# Patient Record
Sex: Female | Born: 1970 | Race: White | Hispanic: No | Marital: Married | Smoking: Never smoker
Health system: Southern US, Community
[De-identification: ages and names within clinical notes are randomized; demographics above are authoritative.]

## PROBLEM LIST (undated history)

## (undated) DIAGNOSIS — E119 Type 2 diabetes mellitus without complications: Secondary | ICD-10-CM

## (undated) DIAGNOSIS — I10 Essential (primary) hypertension: Secondary | ICD-10-CM

## (undated) HISTORY — PX: ABDOMINAL HYSTERECTOMY: SHX81

## (undated) HISTORY — PX: COLON SURGERY: SHX602

## (undated) HISTORY — PX: TONSILLECTOMY: SUR1361

---

## 1999-03-01 ENCOUNTER — Other Ambulatory Visit: Admission: RE | Admit: 1999-03-01 | Discharge: 1999-03-01 | Payer: Self-pay | Admitting: Otolaryngology

## 2001-02-13 ENCOUNTER — Encounter: Payer: Self-pay | Admitting: Internal Medicine

## 2001-02-13 ENCOUNTER — Ambulatory Visit (HOSPITAL_COMMUNITY): Admission: RE | Admit: 2001-02-13 | Discharge: 2001-02-13 | Payer: Self-pay | Admitting: Internal Medicine

## 2003-06-01 ENCOUNTER — Ambulatory Visit (HOSPITAL_COMMUNITY): Admission: RE | Admit: 2003-06-01 | Discharge: 2003-06-01 | Payer: Self-pay | Admitting: Family Medicine

## 2003-06-01 ENCOUNTER — Encounter: Payer: Self-pay | Admitting: Family Medicine

## 2003-06-09 ENCOUNTER — Encounter: Payer: Self-pay | Admitting: Family Medicine

## 2003-06-09 ENCOUNTER — Ambulatory Visit (HOSPITAL_COMMUNITY): Admission: RE | Admit: 2003-06-09 | Discharge: 2003-06-09 | Payer: Self-pay | Admitting: Family Medicine

## 2003-11-24 ENCOUNTER — Ambulatory Visit (HOSPITAL_COMMUNITY): Admission: RE | Admit: 2003-11-24 | Discharge: 2003-11-24 | Payer: Self-pay | Admitting: Family Medicine

## 2005-03-04 ENCOUNTER — Ambulatory Visit: Payer: Self-pay | Admitting: Family Medicine

## 2005-03-18 ENCOUNTER — Ambulatory Visit: Payer: Self-pay | Admitting: Family Medicine

## 2005-05-16 ENCOUNTER — Ambulatory Visit: Payer: Self-pay | Admitting: Family Medicine

## 2005-05-20 ENCOUNTER — Ambulatory Visit: Payer: Self-pay | Admitting: Internal Medicine

## 2005-05-20 ENCOUNTER — Ambulatory Visit (HOSPITAL_COMMUNITY): Admission: RE | Admit: 2005-05-20 | Discharge: 2005-05-20 | Payer: Self-pay | Admitting: Internal Medicine

## 2005-07-01 ENCOUNTER — Ambulatory Visit: Payer: Self-pay | Admitting: Family Medicine

## 2005-07-03 ENCOUNTER — Emergency Department (HOSPITAL_COMMUNITY): Admission: EM | Admit: 2005-07-03 | Discharge: 2005-07-03 | Payer: Self-pay | Admitting: Emergency Medicine

## 2005-10-02 ENCOUNTER — Ambulatory Visit: Payer: Self-pay | Admitting: Family Medicine

## 2005-10-03 ENCOUNTER — Ambulatory Visit (HOSPITAL_COMMUNITY): Admission: RE | Admit: 2005-10-03 | Discharge: 2005-10-03 | Payer: Self-pay | Admitting: Family Medicine

## 2006-01-28 ENCOUNTER — Ambulatory Visit: Payer: Self-pay | Admitting: Family Medicine

## 2006-11-05 ENCOUNTER — Ambulatory Visit: Payer: Self-pay | Admitting: Family Medicine

## 2006-11-11 ENCOUNTER — Ambulatory Visit: Payer: Self-pay | Admitting: Family Medicine

## 2006-12-12 ENCOUNTER — Ambulatory Visit: Payer: Self-pay | Admitting: Family Medicine

## 2007-09-09 ENCOUNTER — Ambulatory Visit: Payer: Self-pay | Admitting: Internal Medicine

## 2007-09-11 ENCOUNTER — Ambulatory Visit (HOSPITAL_COMMUNITY): Admission: RE | Admit: 2007-09-11 | Discharge: 2007-09-11 | Payer: Self-pay | Admitting: Internal Medicine

## 2007-10-01 ENCOUNTER — Encounter: Payer: Self-pay | Admitting: Family Medicine

## 2007-10-09 ENCOUNTER — Ambulatory Visit: Payer: Self-pay | Admitting: Internal Medicine

## 2007-10-09 ENCOUNTER — Ambulatory Visit: Payer: Self-pay | Admitting: Family Medicine

## 2007-10-10 ENCOUNTER — Emergency Department (HOSPITAL_COMMUNITY): Admission: EM | Admit: 2007-10-10 | Discharge: 2007-10-10 | Payer: Self-pay | Admitting: Emergency Medicine

## 2007-10-15 ENCOUNTER — Ambulatory Visit (HOSPITAL_COMMUNITY): Admission: RE | Admit: 2007-10-15 | Discharge: 2007-10-15 | Payer: Self-pay | Admitting: Family Medicine

## 2007-10-22 ENCOUNTER — Encounter (HOSPITAL_COMMUNITY): Admission: RE | Admit: 2007-10-22 | Discharge: 2007-11-21 | Payer: Self-pay | Admitting: Family Medicine

## 2007-10-26 ENCOUNTER — Ambulatory Visit (HOSPITAL_COMMUNITY): Admission: RE | Admit: 2007-10-26 | Discharge: 2007-10-26 | Payer: Self-pay | Admitting: Family Medicine

## 2008-05-22 ENCOUNTER — Emergency Department (HOSPITAL_COMMUNITY): Admission: EM | Admit: 2008-05-22 | Discharge: 2008-05-23 | Payer: Self-pay | Admitting: Emergency Medicine

## 2008-06-13 ENCOUNTER — Encounter (INDEPENDENT_AMBULATORY_CARE_PROVIDER_SITE_OTHER): Payer: Self-pay | Admitting: *Deleted

## 2008-06-13 DIAGNOSIS — N281 Cyst of kidney, acquired: Secondary | ICD-10-CM | POA: Insufficient documentation

## 2008-06-15 ENCOUNTER — Ambulatory Visit (HOSPITAL_COMMUNITY): Admission: RE | Admit: 2008-06-15 | Discharge: 2008-06-15 | Payer: Self-pay | Admitting: Family Medicine

## 2008-06-15 ENCOUNTER — Encounter: Payer: Self-pay | Admitting: Family Medicine

## 2008-06-16 ENCOUNTER — Telehealth: Payer: Self-pay | Admitting: Family Medicine

## 2008-06-16 ENCOUNTER — Ambulatory Visit (HOSPITAL_COMMUNITY): Admission: RE | Admit: 2008-06-16 | Discharge: 2008-06-16 | Payer: Self-pay | Admitting: Family Medicine

## 2008-09-26 ENCOUNTER — Other Ambulatory Visit: Admission: RE | Admit: 2008-09-26 | Discharge: 2008-09-26 | Payer: Self-pay | Admitting: Obstetrics and Gynecology

## 2008-10-26 ENCOUNTER — Ambulatory Visit (HOSPITAL_COMMUNITY): Admission: RE | Admit: 2008-10-26 | Discharge: 2008-10-27 | Payer: Self-pay | Admitting: Obstetrics & Gynecology

## 2008-10-26 ENCOUNTER — Encounter: Payer: Self-pay | Admitting: Obstetrics & Gynecology

## 2008-12-02 ENCOUNTER — Ambulatory Visit (HOSPITAL_COMMUNITY): Admission: RE | Admit: 2008-12-02 | Discharge: 2008-12-02 | Payer: Self-pay | Admitting: Urology

## 2008-12-25 ENCOUNTER — Telehealth (INDEPENDENT_AMBULATORY_CARE_PROVIDER_SITE_OTHER): Payer: Self-pay | Admitting: Family Medicine

## 2009-02-23 ENCOUNTER — Ambulatory Visit: Payer: Self-pay | Admitting: Family Medicine

## 2009-02-23 DIAGNOSIS — E669 Obesity, unspecified: Secondary | ICD-10-CM | POA: Insufficient documentation

## 2009-02-24 LAB — CONVERTED CEMR LAB
BUN: 8 mg/dL (ref 6–23)
Basophils Absolute: 0 10*3/uL (ref 0.0–0.1)
Basophils Relative: 1 % (ref 0–1)
Calcium: 9.2 mg/dL (ref 8.4–10.5)
Cholesterol: 238 mg/dL — ABNORMAL HIGH (ref 0–200)
Creatinine, Ser: 0.63 mg/dL (ref 0.40–1.20)
Glucose, Bld: 101 mg/dL — ABNORMAL HIGH (ref 70–99)
Hemoglobin: 13.3 g/dL (ref 12.0–15.0)
MCHC: 31 g/dL (ref 30.0–36.0)
Monocytes Absolute: 0.4 10*3/uL (ref 0.1–1.0)
Neutro Abs: 4 10*3/uL (ref 1.7–7.7)
RDW: 14.7 % (ref 11.5–15.5)
Sodium: 139 meq/L (ref 135–145)
Total CHOL/HDL Ratio: 4.9

## 2009-04-13 ENCOUNTER — Telehealth: Payer: Self-pay | Admitting: Family Medicine

## 2009-04-24 ENCOUNTER — Ambulatory Visit: Payer: Self-pay | Admitting: Family Medicine

## 2009-04-24 DIAGNOSIS — I10 Essential (primary) hypertension: Secondary | ICD-10-CM | POA: Insufficient documentation

## 2009-04-24 DIAGNOSIS — E785 Hyperlipidemia, unspecified: Secondary | ICD-10-CM | POA: Insufficient documentation

## 2009-04-24 DIAGNOSIS — E049 Nontoxic goiter, unspecified: Secondary | ICD-10-CM | POA: Insufficient documentation

## 2009-04-28 ENCOUNTER — Ambulatory Visit (HOSPITAL_COMMUNITY): Admission: RE | Admit: 2009-04-28 | Discharge: 2009-04-28 | Payer: Self-pay | Admitting: Family Medicine

## 2009-05-29 ENCOUNTER — Telehealth: Payer: Self-pay | Admitting: Family Medicine

## 2009-08-08 ENCOUNTER — Encounter (INDEPENDENT_AMBULATORY_CARE_PROVIDER_SITE_OTHER): Payer: Self-pay

## 2009-08-08 ENCOUNTER — Ambulatory Visit: Payer: Self-pay | Admitting: Family Medicine

## 2009-08-08 DIAGNOSIS — F3289 Other specified depressive episodes: Secondary | ICD-10-CM | POA: Insufficient documentation

## 2009-08-08 DIAGNOSIS — F329 Major depressive disorder, single episode, unspecified: Secondary | ICD-10-CM

## 2009-08-09 ENCOUNTER — Ambulatory Visit (HOSPITAL_COMMUNITY): Payer: Self-pay | Admitting: Psychiatry

## 2009-08-10 DIAGNOSIS — J309 Allergic rhinitis, unspecified: Secondary | ICD-10-CM | POA: Insufficient documentation

## 2009-08-18 ENCOUNTER — Telehealth: Payer: Self-pay | Admitting: Family Medicine

## 2009-09-05 ENCOUNTER — Telehealth: Payer: Self-pay | Admitting: Family Medicine

## 2009-09-18 ENCOUNTER — Emergency Department (HOSPITAL_COMMUNITY): Admission: EM | Admit: 2009-09-18 | Discharge: 2009-09-18 | Payer: Self-pay | Admitting: Emergency Medicine

## 2009-09-18 ENCOUNTER — Telehealth: Payer: Self-pay | Admitting: Family Medicine

## 2009-09-18 ENCOUNTER — Encounter: Payer: Self-pay | Admitting: Family Medicine

## 2009-10-05 ENCOUNTER — Encounter (INDEPENDENT_AMBULATORY_CARE_PROVIDER_SITE_OTHER): Payer: Self-pay | Admitting: *Deleted

## 2010-01-23 IMAGING — US US RENAL
1 series · 13 of 23 positions shown · non-contrast
Comparison: none

Addendum BeginsOriginal report by Dr. Ahern.  Following addendum by Dr. Sa Eed?Andriusha on 11/15/2006:
 This addendum is given for the purpose of comparing CT scan performed at [facility] [[facility]OS[facility]] dated 09/02/2007 with ultrasound performed at [HOSPITAL] 10/26/2007.  Note is made that on the patient?s CT scan, an exophytic lesion which was indeterminate was seen off the interpolar region of the left kidney.  The lesion does not have the typical appearance of cyst by CT scan or ultrasound.  Therefore, MRI of the abdomen is recommended to exclude neoplasm.   

 Addendum Ends
HISTORY: Abnormal CT, renal lesion

[Series 1: unknown · 0.35mm/px · 13 of 23 slices shown]
[im 1/23]
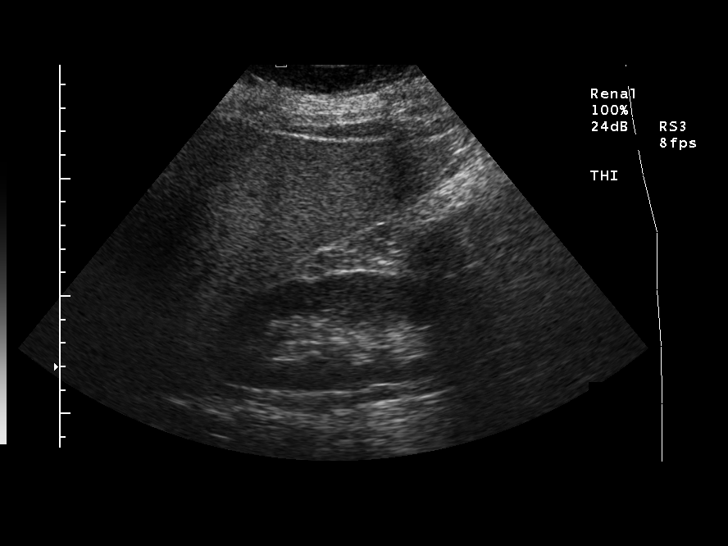
[im 3/23]
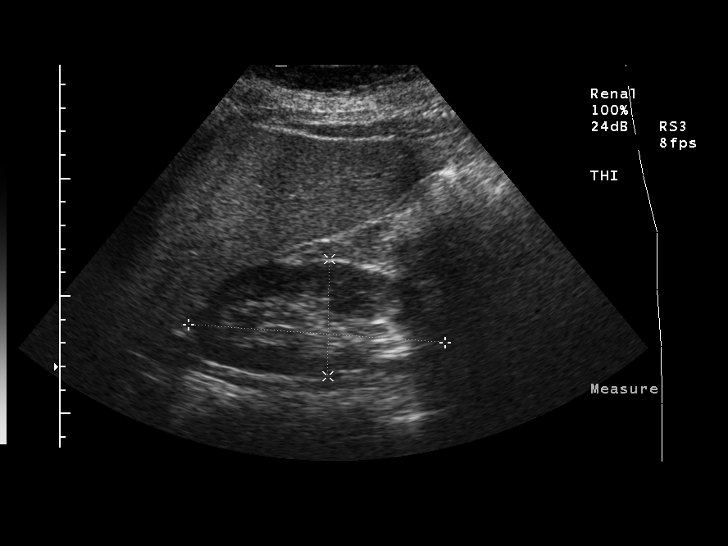
[im 5/23]
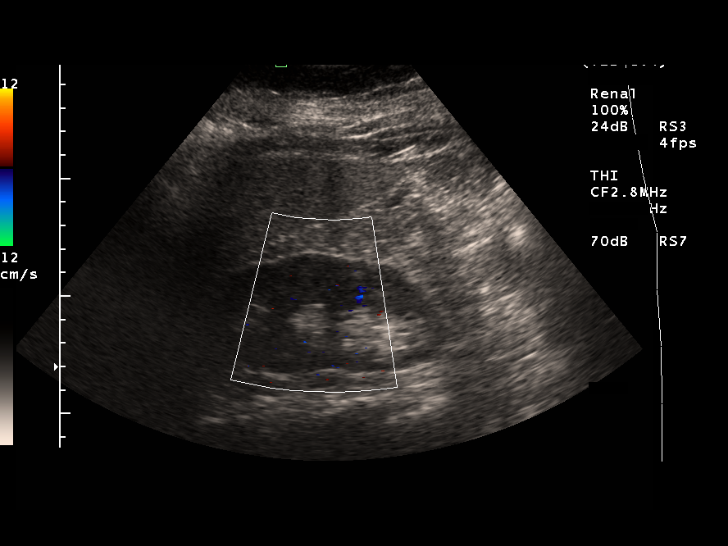
[im 7/23]
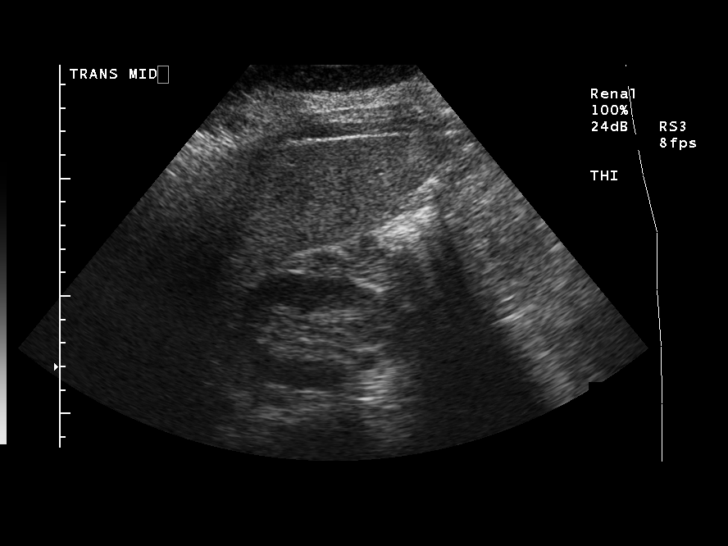
[im 8/23]
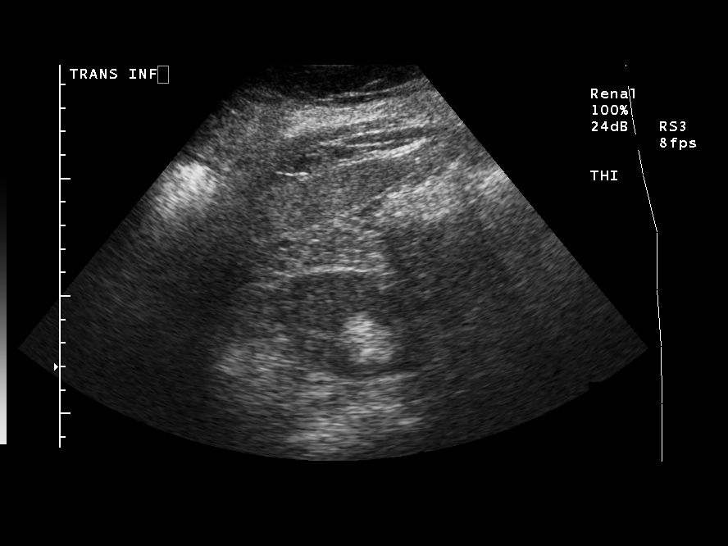
[im 10/23]
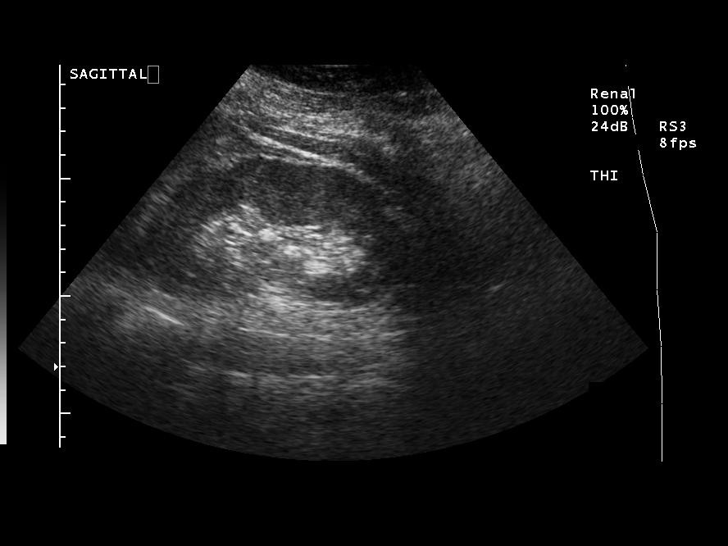
[im 12/23]
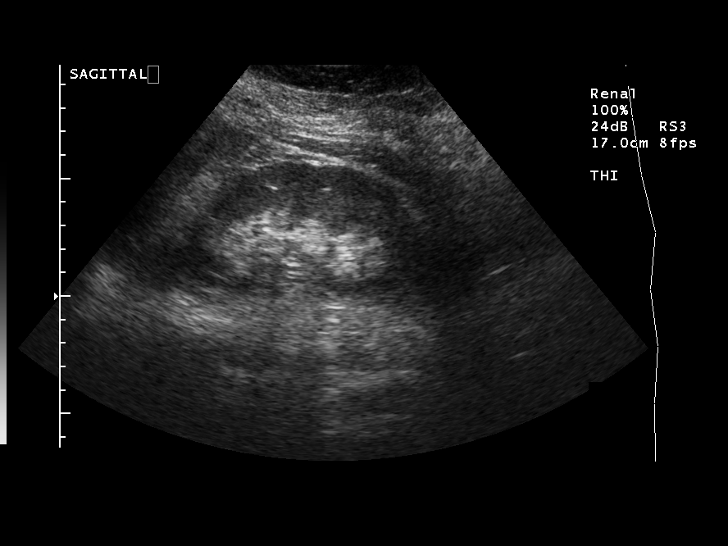
[im 14/23]
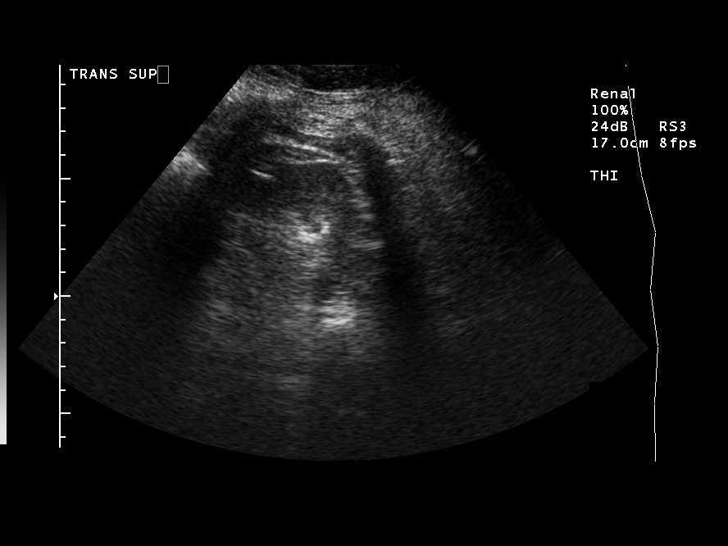
[im 16/23]
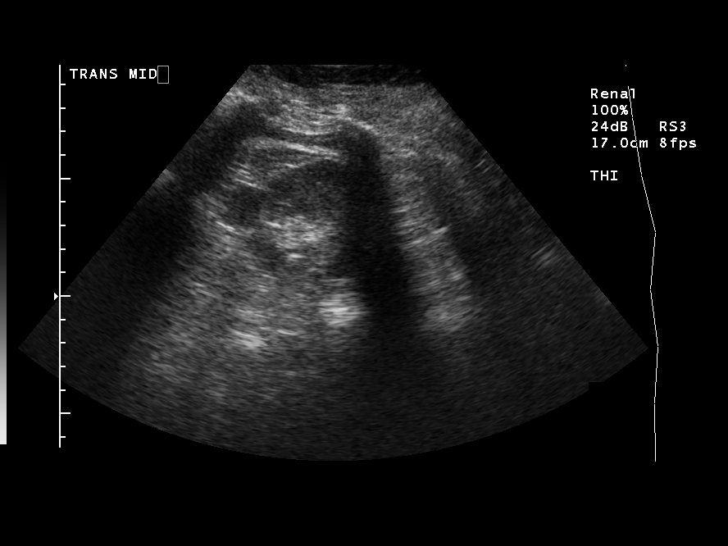
[im 17/23]
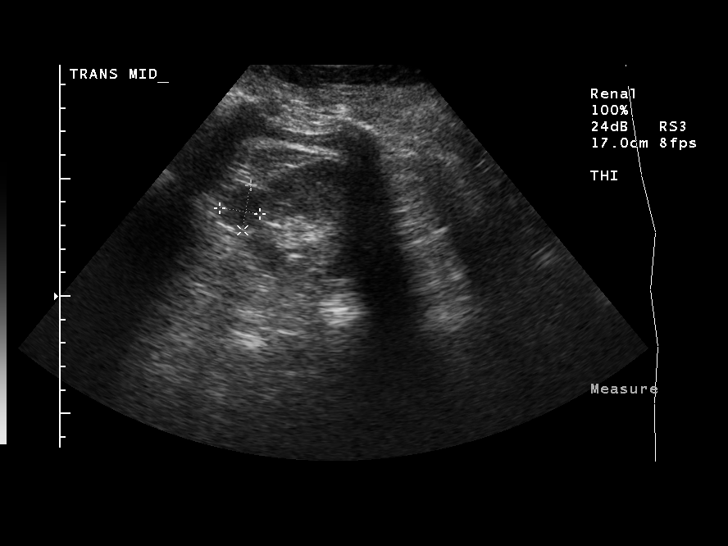
[im 19/23]
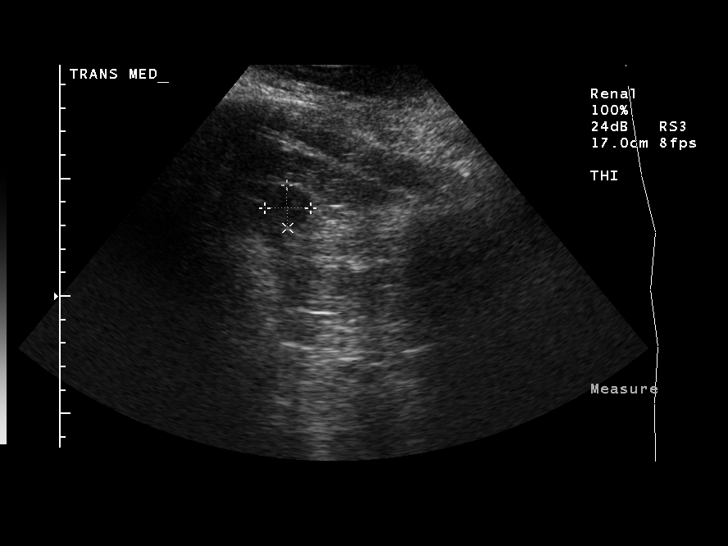
[im 21/23]
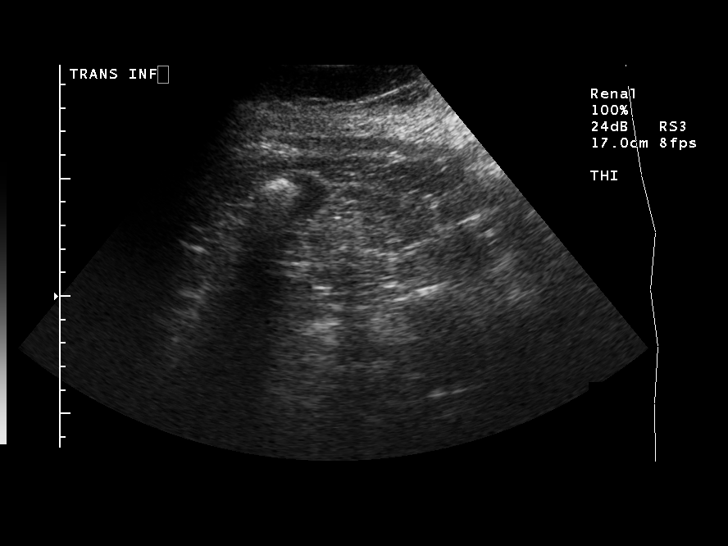
[im 23/23]
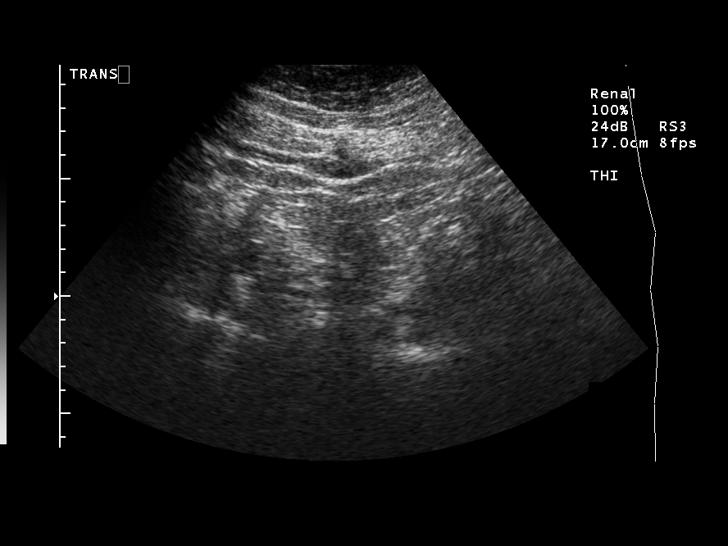

[13 of 23 positions shown; findings below may reference images not displayed]

RENAL ULTRASOUND:

Sonography of kidneys and urinary bladder performed.
No preceding CT is available at this institution for comparison.

Kidneys measure 11.0 cm length right and 11.6 cm length left.
Normal renal cortical thickness and echogenicity bilaterally.
No shadowing calcification or hydronephrosis.
Tiny hypoechoic nodule seen in inferior pole left kidney medially, 2.0 x 1.7 x
1.8 cm.
This contains enhanced through-transmission on several images but scattered low
level internal echoes suggesting mildly complicated cyst.
No other renal mass identified.
Bladder is decompressed and inadequately evaluated.
Incidentally noted increased hepatic echogenicity, question fatty infiltration.
IMPRESSION: Question fatty liver.
Probable complicated cyst inferior pole left kidney 2.0 cm greatest size, though
no preceding CT is available for comparison.
Recommend any prior outside CT examination be provided for direct comparison.
Unless the CT can be obtained, recommend followup ultrasound in 4 to 6 months to
confirm stability.

## 2010-04-06 ENCOUNTER — Encounter: Payer: Self-pay | Admitting: Family Medicine

## 2010-07-09 ENCOUNTER — Ambulatory Visit (HOSPITAL_COMMUNITY): Admission: RE | Admit: 2010-07-09 | Discharge: 2010-07-09 | Payer: Self-pay | Admitting: Family Medicine

## 2010-07-10 ENCOUNTER — Ambulatory Visit (HOSPITAL_COMMUNITY): Admission: RE | Admit: 2010-07-10 | Discharge: 2010-07-10 | Payer: Self-pay | Admitting: Internal Medicine

## 2010-10-05 ENCOUNTER — Emergency Department (HOSPITAL_COMMUNITY)
Admission: EM | Admit: 2010-10-05 | Discharge: 2010-10-05 | Payer: Self-pay | Source: Home / Self Care | Admitting: Emergency Medicine

## 2010-10-06 ENCOUNTER — Emergency Department (HOSPITAL_COMMUNITY)
Admission: EM | Admit: 2010-10-06 | Discharge: 2010-10-06 | Payer: Self-pay | Source: Home / Self Care | Admitting: Emergency Medicine

## 2010-10-15 LAB — COMPREHENSIVE METABOLIC PANEL
ALT: 35 U/L (ref 0–35)
AST: 25 U/L (ref 0–37)
Albumin: 3.8 g/dL (ref 3.5–5.2)
Alkaline Phosphatase: 97 U/L (ref 39–117)
BUN: 19 mg/dL (ref 6–23)
CO2: 25 mEq/L (ref 19–32)
Calcium: 8.9 mg/dL (ref 8.4–10.5)
Chloride: 97 mEq/L (ref 96–112)
Creatinine, Ser: 0.73 mg/dL (ref 0.4–1.2)
GFR calc Af Amer: >60 mL/min (ref >60)
GFR calc non Af Amer: >60 mL/min (ref >60)
Glucose, Bld: 113 mg/dL — ABNORMAL HIGH (ref 70–99)
Potassium: 3.5 mEq/L (ref 3.5–5.1)
Sodium: 132 mEq/L — ABNORMAL LOW (ref 135–145)
Total Bilirubin: 0.3 mg/dL (ref 0.3–1.2)
Total Protein: 7.2 g/dL (ref 6.0–8.3)

## 2010-10-15 LAB — URINALYSIS, ROUTINE W REFLEX MICROSCOPIC
Bilirubin Urine: NEGATIVE
Bilirubin Urine: NEGATIVE
Hgb urine dipstick: NEGATIVE
Hgb urine dipstick: NEGATIVE
Ketones, ur: NEGATIVE mg/dL
Nitrite: NEGATIVE
Nitrite: NEGATIVE
Protein, ur: NEGATIVE mg/dL
Protein, ur: NEGATIVE mg/dL
Specific Gravity, Urine: 1.025 (ref 1.005–1.030)
Specific Gravity, Urine: 1.025 (ref 1.005–1.030)
Urine Glucose, Fasting: NEGATIVE mg/dL
Urine Glucose, Fasting: NEGATIVE mg/dL
Urobilinogen, UA: 0.2 mg/dL (ref 0.0–1.0)
Urobilinogen, UA: 0.2 mg/dL (ref 0.0–1.0)
pH: 6 (ref 5.0–8.0)
pH: 6 (ref 5.0–8.0)

## 2010-10-15 LAB — CBC
HCT: 38.8 % (ref 36.0–46.0)
Hemoglobin: 13.3 g/dL (ref 12.0–15.0)
MCH: 28.4 pg (ref 26.0–34.0)
MCHC: 34.3 g/dL (ref 30.0–36.0)
MCV: 82.9 fL (ref 78.0–100.0)
Platelets: 381 10*3/uL (ref 150–400)
RBC: 4.68 MIL/uL (ref 3.87–5.11)
RDW: 13.2 % (ref 11.5–15.5)
WBC: 7 10*3/uL (ref 4.0–10.5)

## 2010-10-15 LAB — DIFFERENTIAL
Basophils Absolute: 0.1 10*3/uL (ref 0.0–0.1)
Basophils Relative: 1 % (ref 0–1)
Eosinophils Absolute: 0.2 10*3/uL (ref 0.0–0.7)
Eosinophils Relative: 3 % (ref 0–5)
Lymphocytes Relative: 43 % (ref 12–46)
Lymphs Abs: 3 10*3/uL (ref 0.7–4.0)
Monocytes Absolute: 0.5 10*3/uL (ref 0.1–1.0)
Monocytes Relative: 7 % (ref 3–12)
Neutro Abs: 3.3 10*3/uL (ref 1.7–7.7)
Neutrophils Relative %: 47 % (ref 43–77)

## 2010-10-15 LAB — LIPASE, BLOOD: Lipase: 20 U/L (ref 11–59)

## 2010-10-15 LAB — POCT PREGNANCY, URINE: Preg Test, Ur: NEGATIVE

## 2010-10-20 ENCOUNTER — Encounter: Payer: Self-pay | Admitting: Obstetrics and Gynecology

## 2010-10-21 ENCOUNTER — Encounter: Payer: Self-pay | Admitting: Family Medicine

## 2010-10-30 NOTE — Letter (Signed)
Summary: medical release  medical release   Imported By: Lind Guest 04/06/2010 14:47:39  _____________________________________________________________________  External Attachment:    Type:   Image     Comment:   External Document

## 2010-10-30 NOTE — Letter (Signed)
Summary: phone notes  phone notes   Imported By: Lind Guest 03/30/2010 15:00:12  _____________________________________________________________________  External Attachment:    Type:   Image     Comment:   External Document

## 2010-10-30 NOTE — Letter (Signed)
Summary: demo  demo   Imported By: Lind Guest 03/30/2010 14:56:45  _____________________________________________________________________  External Attachment:    Type:   Image     Comment:   External Document

## 2010-10-30 NOTE — Letter (Signed)
Summary: labs  labs   Imported By: Lind Guest 03/30/2010 14:57:39  _____________________________________________________________________  External Attachment:    Type:   Image     Comment:   External Document

## 2010-10-30 NOTE — Letter (Signed)
Summary: history and physical  history and physical   Imported By: Lind Guest 03/30/2010 14:57:14  _____________________________________________________________________  External Attachment:    Type:   Image     Comment:   External Document

## 2010-10-30 NOTE — Letter (Signed)
Summary: 1st Missed Appt.  [facility]  [address]   [place], [place] [address]   Phone: [phone]  Fax: [phone]    October 05, 2009  MRN: [id number]  [facility] [address] [place], [place]  [address]  Dear Ms. Ucci,  At [facility], we make every attempt to fit patients into our schedule by reserving several appointment slots for same-day appointments.  However, we cannot always make appointments for patients the same day they are calling.  At the end of the day, we look back at our schedule and find that because of last-minute cancellations and patients not showing up for their scheduled appointments, we have several appointment slots that are left open and could have been used by another person who really needed it.  In the past, you may have been one of the patients who could not get in when you needed to.  But recently, you were one of the patients with an appointment that you didn't show up for or canceled too late for [place] to fill it.  We choose not to charge no-show or last minute cancellation fees to our patients, like many other offices do.  We do not wish to institute that policy and hope we never have to.  However, we kindly request that you assist [place] by providing at least 24 hours' notice if you can't make your appointment.  If no-shows or late cancellations become habitual (i.e. Three or more in a one-year period), we may terminate the physician-patient relationship.    Thank you for your consideration and cooperation.   Altamease Oiler

## 2010-10-30 NOTE — Letter (Signed)
Summary: x rays  x rays   Imported By: Lind Guest 03/30/2010 15:00:37  _____________________________________________________________________  External Attachment:    Type:   Image     Comment:   External Document

## 2010-10-30 NOTE — Assessment & Plan Note (Signed)
Summary: OV   Vital Signs:  Patient profile:   40 year old female Menstrual status:  hysterectomy Height:      63.5 inches Weight:      201 pounds BMI:     35.17 O2 Sat:      98 % Pulse rate:   86 / minute Pulse rhythm:   regular Resp:     16 per minute BP sitting:   139 / 97  (left arm) Cuff size:   large  Vitals Entered By: Everitt Amber (August 08, 2009 2:19 PM)  Nutrition Counseling: Patient's BMI is greater than 25 and therefore counseled on weight management options. CC: yellow tinged with blood sinus drainage and pressure, headaches and diarrhea for the past 4 days, Thinks the diarrhea may be due to her nerves, states she has been really stressed out lately Is Patient Diabetic? No   CC:  yellow tinged with blood sinus drainage and pressure, headaches and diarrhea for the past 4 days, Thinks the diarrhea may be due to her nerves, and states she has been really stressed out lately.  History of Present Illness: Pt in today stating that she has been under increased stress for some time but mainly in the last one week. Her son has moved out to live with his father who she states is often absent and does not care. She has been tearful, unable to focus and concentrate, and actually thought of hurting herself with a razor in her bathroom 2 nights ago. She called her sister, adnvows that she will not act on any suicidal ideation. She ahs also had gI upset during this time which she attributes to her "nerves" She has been having inc allergy symptoms with esxcessive post nasal drainage but denies any fever or chills.  Preventive Screening-Counseling & Management  Alcohol-Tobacco     Smoking Status: never  Current Medications (verified): 1)  Maxzide 75-50 Mg Tabs (Triamterene-Hctz) .... Take 1 Tablet By Mouth Once A Day  Allergies (verified): No Known Drug Allergies  Review of Systems      See HPI General:  Complains of fatigue. Eyes:  Denies blurring. ENT:  Complains of  earache, nasal congestion, sinus pressure, and sore throat; 4 day h/o upper resp symptoms. CV:  Denies chest pain or discomfort, palpitations, and swelling of feet. Resp:  Denies cough and sputum productive. GI:  Complains of abdominal pain, diarrhea, nausea, and vomiting; denies bloody stools, constipation, and dark tarry stools; 1 week h/o symptoms  since her 99 y/o son left home. GU:  Denies dysuria and urinary frequency; pt is followed by urology for renal cyst which she reports has decreased in size. MS:  Denies joint pain and stiffness. Derm:  Denies itching, lesion(s), and rash. Neuro:  Complains of headaches; denies seizures and sensation of room spinning. Psych:  Complains of anxiety, depression, easily tearful, irritability, mental problems, and suicidal thoughts/plans; pt thought of hurting herself with a razor 2 days ago. Endo:  Denies cold intolerance, excessive hunger, excessive thirst, excessive urination, heat intolerance, polyuria, and weight change. Heme:  Denies abnormal bruising and bleeding. Allergy:  Complains of seasonal allergies; denies hives or rash, itching eyes, and sneezing.  Physical Exam  General:  Well-developed,obese,in no acute distress; alert,appropriate and cooperative throughout examination HEENT: No facial asymmetry,  EOMI, No sinus tenderness, TM's Clear, oropharynx  pink and moist. Erythema and edema of nasal mucosa  Chest: Clear to auscultation bilaterally.  CVS: S1, S2, No murmurs, No S3.   Abd:  Soft, Nontender.  MS: Adequate ROM spine, hips, shoulders and knees.  Ext: No edema.   CNS: CN 2-12 intact, power tone and sensation normal throughout.   Skin: Intact, no visible lesions or rashes.  Psych: Good eye contact, normal affect.  Memory intact, tearful, anxious and depressed appearing.  Head:  Normocephalic and atraumatic without obvious abnormalities. No apparent alopecia or balding.   Impression & Recommendations:  Problem # 1:  DEPRESSION  (ICD-311) Assessment Deteriorated  Her updated medication list for this problem includes:    Zoloft 25 Mg Tabs (Sertraline hcl) .Marland Kitchen... Take 1 tablet by mouth once a day for  4 weeks, start 08/08/2009    Zoloft 50 Mg Tabs (Sertraline hcl) .Marland Kitchen... Take 1 tablet by mouth once a day, start this dose in december on completion of the first 4 weeks of 25mg  one daily  Orders: Psychology Referral (Psychology)  Problem # 2:  HYPERTENSION (ICD-401.9) Assessment: Deteriorated  Her updated medication list for this problem includes:    Maxzide 75-50 Mg Tabs (Triamterene-hctz) .Marland Kitchen... Take 1 tablet by mouth once a day  BP today: 139/97 Prior BP: 140/86 (04/24/2009)  Labs Reviewed: K+: 4.3 (02/23/2009) Creat: : 0.63 (02/23/2009)   Chol: 238 (02/23/2009)   HDL: 49 (02/23/2009)   LDL: 121 (02/23/2009)   TG: 338 (02/23/2009)  Problem # 3:  HYPERLIPIDEMIA (ICD-272.4) Assessment: Comment Only  Orders: T-Lipid Profile ([address])    HDL:49 (02/23/2009)  LDL:121 (02/23/2009)  Chol:238 (02/23/2009)  Trig:338 (02/23/2009), low fat diet discussed and encouraged as well as regular exercise  Problem # 4:  OBESITY (ICD-278.00) Assessment: Improved  Ht: 63.5 (08/08/2009)   Wt: 201 (08/08/2009)   BMI: 35.17 (08/08/2009)  Problem # 5:  RHINOSINUSITIS, ALLERGIC, 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 Take 1 tablet by mouth once a day  Complete Medication List: 1)  Maxzide 75-50 Mg Tabs (Triamterene-hctz) .... Take 1 tablet by mouth once a day 2)  Fexofenadine Hcl 180 Mg Tabs (Fexofenadine hcl) .... Take 1 tablet by mouth once a day 3)  Zoloft 25 Mg Tabs (Sertraline hcl) .... Take 1 tablet by mouth once a day for  4 weeks, start 08/08/2009 4)  Zoloft 50 Mg Tabs (Sertraline hcl) .... Take 1 tablet by mouth once a day, start this dose in december on completion of the first 4 weeks of 25mg  one  daily  Patient Instructions: 1)  f/u in 8 weeks. 2)  you will be referred to therapy and psychiatry, and will start med for depression. 3)  Pls call for help if youir symptoms worsen. 4)  It is important that you exercise regularly at least 20 minutes 5 times a week. If you develop chest pain, have severe difficulty breathing, or feel very tired , stop exercising immediately and seek medical attention. 5)  You need to lose weight. Consider a lower calorie diet and regular exercise.  6)  Lipid Panel prior to visit, ICD-9:  in 2 months Prescriptions: ZOLOFT 50 MG TABS (SERTRALINE HCL) Take 1 tablet by mouth once a day, start this dose in December on completion of the first 4 weeks of 25mg  one daily  #30 x 3   Entered and Authorized by:   Syliva Overman MD   Signed by:   Syliva Overman MD on 08/08/2009   Method used:   Printed then faxed to ...       [facility] (retail)       [address]       [place], [place]  [address]       Ph: [phone] or [phone]       Fax: [phone]   RxID:   [id number] ZOLOFT 25 MG TABS (SERTRALINE HCL) Take 1 tablet by mouth once a day for  4 weeks, start 08/08/2009  #30 x 0   Entered and Authorized by:   Syliva Overman MD   Signed by:   Syliva Overman MD on 08/08/2009   Method used:   Printed then faxed to ...       [facility] (retail)       [address]       [place], [place]  [address]       Ph: [phone] or [phone]       Fax: [phone]   RxID:   [id number] ZOLOFT 25 MG TABS (SERTRALINE HCL) Take 1 tablet by mouth once a day  #30 x 3   Entered and Authorized by:   Syliva Overman MD   Signed by:   Syliva Overman MD on 08/08/2009   Method used:   Printed then faxed to ...       [facility] (retail)       [address]       [place], [place]  [address]       Ph: [phone] or [phone]       Fax:  [phone]   RxID:   [id number] FEXOFENADINE HCL 180 MG TABS (FEXOFENADINE HCL) Take 1 tablet by mouth once a day  #30 x 3   Entered and Authorized by:   Syliva Overman MD   Signed by:   Syliva Overman MD on 08/08/2009   Method used:   Printed then faxed to ...       [facility] (retail)       [address]       [place], [place]  [address]       Ph: [phone] or [phone]       Fax: [phone]   RxID:   [id number]

## 2010-10-30 NOTE — Letter (Signed)
Summary: consults  consults   Imported By: Lind Guest 03/30/2010 14:56:08  _____________________________________________________________________  External Attachment:    Type:   Image     Comment:   External Document

## 2010-10-30 NOTE — Letter (Signed)
Summary: misc  misc   Imported By: Lind Guest 03/30/2010 14:59:36  _____________________________________________________________________  External Attachment:    Type:   Image     Comment:   External Document

## 2010-10-30 NOTE — Letter (Signed)
Summary: office notes  office notes   Imported By: Lind Guest 03/30/2010 14:58:23  _____________________________________________________________________  External Attachment:    Type:   Image     Comment:   External Document

## 2010-12-14 ENCOUNTER — Encounter: Payer: Self-pay | Admitting: Family Medicine

## 2010-12-18 ENCOUNTER — Ambulatory Visit: Payer: Self-pay | Admitting: Gastroenterology

## 2010-12-18 ENCOUNTER — Encounter (INDEPENDENT_AMBULATORY_CARE_PROVIDER_SITE_OTHER): Payer: Self-pay | Admitting: *Deleted

## 2010-12-21 ENCOUNTER — Other Ambulatory Visit (HOSPITAL_COMMUNITY): Payer: Self-pay | Admitting: Family Medicine

## 2010-12-21 ENCOUNTER — Ambulatory Visit (HOSPITAL_COMMUNITY)
Admission: RE | Admit: 2010-12-21 | Discharge: 2010-12-21 | Disposition: A | Discharge status: Home / Self Care | Payer: BC Managed Care – PPO | Source: Ambulatory Visit | Attending: Family Medicine | Admitting: Family Medicine

## 2010-12-21 DIAGNOSIS — R059 Cough, unspecified: Secondary | ICD-10-CM

## 2010-12-21 DIAGNOSIS — R0602 Shortness of breath: Secondary | ICD-10-CM | POA: Insufficient documentation

## 2010-12-21 DIAGNOSIS — R05 Cough: Secondary | ICD-10-CM

## 2010-12-21 DIAGNOSIS — I10 Essential (primary) hypertension: Secondary | ICD-10-CM | POA: Insufficient documentation

## 2010-12-21 DIAGNOSIS — R042 Hemoptysis: Secondary | ICD-10-CM | POA: Insufficient documentation

## 2010-12-27 NOTE — Letter (Signed)
Summary: Generic Letter, Intro to Referring  [facility]  [address]   [place], [place] [address]   Phone: [phone]  Fax: [phone]      December 18, 2010             RE: [name]   [birth year]                 [address]                 [place], [place]  [address]  Dear [name],  Patient was a no show for her appointment today.            Sincerely,    [name]  [facility] Ph: [phone]   Fax: [phone]

## 2011-01-14 LAB — CBC
HCT: 34.9 % — ABNORMAL LOW (ref 36.0–46.0)
HCT: 40.2 % (ref 36.0–46.0)
Hemoglobin: 13.6 g/dL (ref 12.0–15.0)
MCHC: 33.3 g/dL (ref 30.0–36.0)
MCV: 81.5 fL (ref 78.0–100.0)
MCV: 82.9 fL (ref 78.0–100.0)
Platelets: 337 10*3/uL (ref 150–400)
RBC: 4.21 MIL/uL (ref 3.87–5.11)
RBC: 4.93 MIL/uL (ref 3.87–5.11)
WBC: 6.7 10*3/uL (ref 4.0–10.5)

## 2011-01-14 LAB — TYPE AND SCREEN
ABO/RH(D): A POS
Antibody Screen: NEGATIVE

## 2011-01-14 LAB — DIFFERENTIAL
Basophils Relative: 1 % (ref 0–1)
Eosinophils Absolute: 0.2 10*3/uL (ref 0.0–0.7)
Eosinophils Relative: 2 % (ref 0–5)
Lymphs Abs: 2.4 10*3/uL (ref 0.7–4.0)
Monocytes Relative: 6 % (ref 3–12)
Neutrophils Relative %: 66 % (ref 43–77)

## 2011-01-14 LAB — URINALYSIS, ROUTINE W REFLEX MICROSCOPIC
Bilirubin Urine: NEGATIVE
Glucose, UA: NEGATIVE mg/dL
Hgb urine dipstick: NEGATIVE
Protein, ur: NEGATIVE mg/dL
Urobilinogen, UA: 0.2 mg/dL (ref 0.0–1.0)

## 2011-01-14 LAB — COMPREHENSIVE METABOLIC PANEL
Alkaline Phosphatase: 119 U/L — ABNORMAL HIGH (ref 39–117)
BUN: 9 mg/dL (ref 6–23)
CO2: 26 mEq/L (ref 19–32)
Chloride: 103 mEq/L (ref 96–112)
Creatinine, Ser: 0.62 mg/dL (ref 0.4–1.2)
GFR calc non Af Amer: >60 mL/min (ref >60)
Glucose, Bld: 111 mg/dL — ABNORMAL HIGH (ref 70–99)
Total Bilirubin: 0.5 mg/dL (ref 0.3–1.2)

## 2011-02-12 NOTE — Op Note (Signed)
[name], [name]                 ACCOUNT NO.:  [id number]   MEDICAL RECORD NO.:  [id number]          PATIENT TYPE:  OIB   LOCATION:  A307                          FACILITY:  [facility]   PHYSICIAN:  Lazaro Arms, M.D.   DATE OF BIRTH:  [birth year]   DATE OF PROCEDURE:  10/26/2008  DATE OF DISCHARGE:                               OPERATIVE REPORT   PREOPERATIVE DIAGNOSES:  1. Dysmenorrhea.  2. Dyspareunia.  3. Lateral pain.   POSTOPERATIVE DIAGNOSES:  1. Dysmenorrhea.  2. Dyspareunia.  3. Lateral pain.  4. Perimenopausal-appearing ovaries and high, well over the pelvis.   PROCEDURE:  Total vaginal hysterectomy.   SURGEON:  Lazaro Arms, MD   ANESTHESIA:  General endotracheal.   FINDINGS:  The patient had a globular uterus, great support, probably  some small fibroids up in the fundus.  Her ovaries were surprisingly  atrophic in appearance.  They were probably about a cm and half in  greatest dimension.  They appeared to be peri or slightly  postmenopausal.  There was no cyst.  No endometriosis.  No adhesions.  Really, could not get to them safely.  I debated whether or not to do a  laparoscopic removal, but I really felt like they are not causing her  any discomfort after having been in there and seeing.  As a result, I  did a vaginal hysterectomy and left the ovaries.   DESCRIPTION OF OPERATION:  The patient was taken to the operating room,  placed in supine position, where she underwent general endotracheal  anesthesia.  She was placed in dorsal lithotomy position in candy cane  stirrups.  She was prepped and draped in usual sterile fashion.  A Foley  catheter was placed.  Weighted speculum was placed. A 0.5% Marcaine with  1:200,000 epinephrine was injected circumferentially about the cervix.  The electrocautery unit was then used, circumferential incision made.  The posterior cul-de-sac was entered without difficulty.  Uterosacral  ligaments were clamped, cut, and  suture ligated.  Cardinal ligaments  were clamped, cut, and suture ligated. Serial pedicles of the cardinal  ligaments were taken.  The anterior peritoneum was then entered without  difficulty.  The anterior and posterior leaves of broad ligament were  plicated.  Uterine vessels were clamped, cut, and suture ligated.  Serial pedicles were taken of the fundus.  Each pedicle was being  clamped, cut, and suture ligated.  The cervix was removed and uterus was  morcellated.  Utero-ovarian ligaments were crossed clamped bilaterally  and suture ligated with good hemostasis.  I spent quite a bit of time  evaluating whether or not I could get the ovaries, they were quite  small, as stated above, and quite high and I made the decision that  could not have gotten them out vaginally.  I saw no pathology, no  endometriosis, no adhesions, no cysts, and as a result, I decided that  in real time in the OR, should not do a laparoscopic removal, which  would have been my only option.  An 80% of her pain is middle and  20% of  it is lateral.  I am hoping that the fibroids were responsible for that,  but I certainly am aware that there is a possibly that she could have  lateral pain postoperatively, but my best judgment at this time would  lead me to think that her ovaries,  1. Are getting ready to fail.  2. Were not a source of lateral pain or dyspareunia.   I then closed the peritoneum in a purse-string fashion and closed the  vagina anterior to posterior without difficulty.  I irrigated the  vaginal cuff and pelvis vigorously before closure.  All pedicles were  hemostatic.  The patient tolerated the procedure well.  She experienced  150 mL of blood loss and was taken to recovery room in good and stable  condition.  All counts were correct x3.  She received a gram of Ancef  prophylactically.      Lazaro Arms, M.D.  Electronically Signed     LHE/MEDQ  D:  10/26/2008  T:  10/27/2008  Job:   [id number]

## 2011-02-12 NOTE — Assessment & Plan Note (Signed)
[name], [name]                  CHART#:  [id number]   DATE:  09/09/2007                       DOB:  1971-07-16   CHIEF COMPLAINT:  Abdominal pain.   HISTORY OF PRESENT ILLNESS:  [name] is a 40 year old Caucasian female who  presents today for further evaluation of acute onset left-sided  abdominal pain, which began last Wednesday.  She states that her abdomen  began to swell last Wednesday.  She had acute onset left-sided abdominal  pain.  She went to [facility] Emergency [facility]  because of the severity of the pain.  She underwent a CT of the abdomen  and pelvis, which revealed a 2 cm exophytic well-defined, low-density  lesion in the left kidney, felt to possibly be complicated by  proteinaceous debris or hemorrhage.  She also had an 18 mm dominant  follicle in the left ovary 16 mm dominant right ovarian follicle and  mild hydrosalpinx on the left side.  She was told to follow up with GI.  She made the appointment today.  Other labs included a glucose of 107,  BUN 12, creatinine 0.6, total bilirubin 0.3, alkaline phosphatase 96,  AST 20, ALT 24, albumin 3.6, lipase 19, amylase 22, CBC with white count  9900.  Hemoglobin 13.2, platelets 471,000, urine pregnancy test was  negative.  Urinalysis was negative.   She has had some nausea, but no vomiting.  She has had acid  regurgitation.  She takes Aciphex p.r.n.  She states she has gained 9  pounds in the last one week.  She feels puffy all over.  She has chronic  prandial loose stools which she has had as long as she can remember.  At  times she does have some incontinence.  She denies any nocturnal  symptoms, no melena or rectal bleeding.   CURRENT MEDICATIONS:  Aciphex as needed.   ALLERGIES:  No known drug allergies.   PAST MEDICAL HISTORY:  She has sinus problems.  She states that her  menstrual cycles have been regular up until last month.  She was 16 days  late for her menses.  Tubal ligation 15 years  ago.  Tonsillectomy.   FAMILY HISTORY:  Mother had a brain aneurysm.  Father has a history of  diabetes and blood clots.  She states her maternal grandfather had  Crohn's disease and died of colon cancer in his 49s.  Maternal  grandmother with possible Crohn's disease as well.   SOCIAL HISTORY:  She is married.  She has two children.  She is employed  at [employer] on third shift.  She has never been a smoker.  No alcohol use.   REVIEW OF SYSTEMS:  See HPI for GI.  Constitutional:  See HPI.  Cardiopulmonary:  No chest pain, shortness of breath.  Genitourinary:  No urinary hesitancy, dysuria, frequency or hematuria.  See HPI for GYN.   PHYSICAL EXAMINATION:  VITAL SIGNS:  Weight 204, height 5 feet 2 inches,  temperature 97.8, blood pressure 130/92.  Pulse is 88.  GENERAL:  Pleasant, obese, Caucasian female in no acute distress.  Skin  warm and dry no jaundice.  HEENT:  Sclerae nonicteric.  Oropharyngeal mucosa, moist and pink.  No  lesions, erythema, or exudate.  No lymphadenopathy, thyromegaly.  CHEST:  Lungs are clear to auscultation.  CARDIOVASCULAR:  Cardiac exam reveals regular rate and rhythm.  Normal  S1 and S2.  No murmurs, rubs, or gallops.  ABDOMEN:  Positive bowel sounds, obese, but symmetrical.  Soft.  She has  diffuse tenderness throughout lower her a abdomen to deep palpation, but  more intense on left low to left mid abdomen.  She becomes tearful with  palpation in this region.  No rebound or guarding.  No organomegaly or  masses appreciated.  No CVA tenderness.  EXTREMITIES:  No edema.   IMPRESSION:  [name] is a 40 year old lady who presents with acute onset  left mid abdominal pain of one week duration.  I had discussed the case  with Dr. Jena Gauss.  She has two issues on her CT which may or may not be  the cause of her pain, but remains a possibility.  She has a 2 cm  exophytic left renal lesions, which appears complicated and needs  further evaluation.  In addition, she has  an a dominant follicle in the  left ovary measuring 18 mm and possible hydrosalpinx.  She has  associated menstrual irregularity and recent painful menses.  She has  chronic post prandial loose stools possibly due to IBS.  No significant  change in her stools at this time.   PLAN:  1. Evaluation with renal ultrasound and pelvic vaginal ultrasound.  2. Retrieve CD of recent CT films.  3. Vicodin 5/500 mg #20 one p.o. t.i.d. p.r.n. pain, zero refills.  4. Further recommendations to follow.       [name], P.A.  Electronically Signed     R. Roetta Sessions, M.D.  Electronically Signed    LL/MEDQ  D:  09/09/2007  T:  09/10/2007  Job:  [id number]   cc:   Milus Mallick. Lodema Hong, M.D.

## 2011-02-15 NOTE — Op Note (Signed)
[name], [name]                 ACCOUNT NO.:  [id number]   MEDICAL RECORD NO.:  [id number]          PATIENT TYPE:  AMB   LOCATION:  DAY                           FACILITY:  [facility]   PHYSICIAN:  R. Roetta Sessions, M.D. DATE OF BIRTH:  [birth year]   DATE OF PROCEDURE:  05/20/2005  DATE OF DISCHARGE:                                 OPERATIVE REPORT   PROCEDURE:  Diagnostic colonoscopy.   INDICATIONS FOR PROCEDURE:  The patient is a 40 year old lady who  experienced some low volume painless hematochezia last week, approximately  three episodes. She saw Dr. Lodema Hong. Dr. Lodema Hong called me. She has been set  up for colonoscopy. This is in the setting of otherwise normal bowel  movements. She denies constipation or diarrhea. No abdominal pain. No  melena. She says for the past two days she has not seen any blood per  rectum, saw no blood with the prep. There is no family history of IBD or  colorectal neoplasia. Colonoscopy is now being done. This approach has been  discussed with the patient. Potential risks, benefits, and alternatives have  been reviewed and questions answered.  She is agreeable. Please see documentation in the medical record.   PROCEDURE NOTE:  O2 saturation, blood pressure, pulse, and respirations were  monitored throughout the entire procedure. Conscious sedation with Versed 4  mg IV and Demerol 75 mg IV in divided doses.   INSTRUMENT:  Olympus video chip system.   FINDINGS:  Digital rectal exam revealed no abnormalities.   ENDOSCOPIC FINDINGS:  Prep was adequate.   Rectum:  Examination of the rectal mucosa including retroflexed view of the  anal verge revealed single excoriated appearing hemorrhoid. Otherwise,  rectal mucosa appeared normal.   Colon:  Colonic mucosa was surveyed from the rectosigmoid junction through  the left, transverse, and right colon to the area of the appendiceal  orifice, ileocecal valve, and cecum. These structures were well seen and  photographed for the record. From this level, the scope was slowly  withdrawn, and all previously mentioned mucosal surfaces were again seen.  The colonic mucosa was well seen and appeared normal. Technically, this was  a very easy colonoscopy with the cecum being a straight shot. The patient  tolerated the procedure well and was reactive to endoscopy.   IMPRESSION:  1.  Single excoriated anocolonic hemorrhoid. Otherwise normal rectum.  2.  Normal colon.   RECOMMENDATIONS:  1.  Hemorrhoid literature provided to Ms. Christenberry.  2.  Ten-day course of Anusol HC suppository, one per rectum at bedtime.  3.  If bleeding recurs, she is let me know, as further evaluation would be      warranted.      Jonathon Bellows, M.D.  Electronically Signed     RMR/MEDQ  D:  05/20/2005  T:  05/20/2005  Job:  [id number]   cc:   Milus Mallick. Lodema Hong, M.D.  [address]  [place], [place] [address]  Fax: [phone]

## 2012-08-18 ENCOUNTER — Encounter (HOSPITAL_COMMUNITY): Payer: Self-pay | Admitting: *Deleted

## 2012-08-18 ENCOUNTER — Emergency Department (HOSPITAL_COMMUNITY)
Admission: EM | Admit: 2012-08-18 | Discharge: 2012-08-18 | Disposition: A | Discharge status: Home / Self Care | Payer: Self-pay | Attending: Emergency Medicine | Admitting: Emergency Medicine

## 2012-08-18 DIAGNOSIS — J029 Acute pharyngitis, unspecified: Secondary | ICD-10-CM | POA: Insufficient documentation

## 2012-08-18 DIAGNOSIS — IMO0001 Reserved for inherently not codable concepts without codable children: Secondary | ICD-10-CM | POA: Insufficient documentation

## 2012-08-18 DIAGNOSIS — R51 Headache: Secondary | ICD-10-CM | POA: Insufficient documentation

## 2012-08-18 MED ORDER — AMOXICILLIN 500 MG PO CAPS: 500.0000 mg | ORAL_CAPSULE | Freq: Three times a day (TID) | ORAL | Status: DC

## 2012-08-18 MED ORDER — HYDROCODONE-ACETAMINOPHEN 5-325 MG PO TABS: 1.0000 | ORAL_TABLET | ORAL | Status: DC | PRN

## 2012-08-18 MED ORDER — MELOXICAM 7.5 MG PO TABS: ORAL_TABLET | ORAL | Status: DC

## 2012-08-18 NOTE — ED Notes (Signed)
Sore throat and headache since last pm.

## 2012-08-18 NOTE — ED Provider Notes (Signed)
History     CSN: [id number]  Arrival date & time 08/18/12  1300   First MD Initiated Contact with Patient 08/18/12 1438      Chief Complaint  Patient presents with  . Sore Throat    (Consider location/radiation/quality/duration/timing/severity/associated sxs/prior treatment) Patient is a 41 y.o. female presenting with pharyngitis. The history is provided by the patient.  Sore Throat This is a new problem. The current episode started yesterday. The problem occurs constantly. The problem has been gradually worsening. Associated symptoms include headaches, myalgias and a sore throat. Pertinent negatives include no abdominal pain, arthralgias, chest pain, coughing or neck pain. The symptoms are aggravated by swallowing. She has tried acetaminophen and rest for the symptoms. The treatment provided no relief.    History reviewed. No pertinent past medical history.  Past Surgical History  Procedure Date  . Abdominal hysterectomy   . Tonsillectomy     History reviewed. No pertinent family history.  History  Substance Use Topics  . Smoking status: Never Smoker   . Smokeless tobacco: Not on file  . Alcohol Use: No    OB History    Grav Para Term Preterm Abortions TAB SAB Ect Mult Living                  Review of Systems  Constitutional: Negative for activity change.       All ROS Neg except as noted in HPI  HENT: Positive for sore throat and sinus pressure. Negative for nosebleeds and neck pain.   Eyes: Negative for photophobia and discharge.  Respiratory: Negative for cough, shortness of breath and wheezing.   Cardiovascular: Negative for chest pain and palpitations.  Gastrointestinal: Negative for abdominal pain and blood in stool.  Genitourinary: Negative for dysuria, frequency and hematuria.  Musculoskeletal: Positive for myalgias. Negative for back pain and arthralgias.  Skin: Negative.   Neurological: Positive for headaches. Negative for dizziness, seizures and  speech difficulty.  Psychiatric/Behavioral: Negative for hallucinations and confusion.    Allergies  Review of patient's allergies indicates no known allergies.  Home Medications   Current Outpatient Rx  Name  Route  Sig  Dispense  Refill  . ACETAMINOPHEN 500 MG PO TABS   Oral   Take 1,000 mg by mouth daily as needed. For pain         . AMOXICILLIN 500 MG PO CAPS   Oral   Take 1 capsule (500 mg total) by mouth 3 (three) times daily.   21 capsule   0   . MELOXICAM 7.5 MG PO TABS      1 po bid with food   12 tablet   0     BP 151/94  Pulse 73  Temp 98.1 F (36.7 C) (Oral)  Resp 18  Ht 5\' 3"  (1.6 m)  Wt 204 lb (92.534 kg)  BMI 36.14 kg/m2  SpO2 100%  Physical Exam  Nursing note and vitals reviewed. Constitutional: She is oriented to person, place, and time. She appears well-developed and well-nourished.  Non-toxic appearance.  HENT:  Head: Normocephalic.  Right Ear: Tympanic membrane and external ear normal.  Left Ear: Tympanic membrane and external ear normal.       There is increased redness of the posterior pharynx. The uvula is enlarged. The airway is patent and the speech is understandable.  Eyes: EOM and lids are normal. Pupils are equal, round, and reactive to light.  Neck: Normal range of motion. Neck supple. Carotid bruit is not present.  Cardiovascular: Normal rate, regular rhythm, normal heart sounds, intact distal pulses and normal pulses.   Pulmonary/Chest: Breath sounds normal. No respiratory distress.  Abdominal: Soft. Bowel sounds are normal. There is no tenderness. There is no guarding.  Musculoskeletal: Normal range of motion.  Lymphadenopathy:       Head (right side): No submandibular adenopathy present.       Head (left side): No submandibular adenopathy present.    She has no cervical adenopathy.  Neurological: She is alert and oriented to person, place, and time. She has normal strength. No cranial nerve deficit or sensory deficit.  Skin:  Skin is warm and dry.  Psychiatric: She has a normal mood and affect. Her speech is normal.    ED Course  Procedures (including critical care time)   Labs Reviewed  RAPID STREP SCREEN   No results found.   1. Pharyngitis       MDM  I have reviewed nursing notes, vital signs, and all appropriate lab and imaging results for this patient. Patient has a pharyngitis present. Patient advised to use salt water gargles 3-4 times a day. Patient also advised to increase fluids, wash hands frequently, and use Mobic for fever and aching. Prescription for Amoxil 3 times daily also given to the patient.       Kathie Dike, [place] 08/20/12 1332

## 2012-08-22 NOTE — ED Provider Notes (Signed)
Medical screening examination/treatment/procedure(s) were performed by non-physician practitioner and as supervising physician I was immediately available for consultation/collaboration.  Donnetta Hutching, MD 08/22/12 [phone]

## 2014-03-04 ENCOUNTER — Emergency Department (HOSPITAL_COMMUNITY)
Admission: EM | Admit: 2014-03-04 | Discharge: 2014-03-05 | Disposition: A | Discharge status: Home / Self Care | Payer: BC Managed Care – PPO | Attending: Emergency Medicine | Admitting: Emergency Medicine

## 2014-03-04 ENCOUNTER — Encounter (HOSPITAL_COMMUNITY): Payer: Self-pay | Admitting: Emergency Medicine

## 2014-03-04 ENCOUNTER — Emergency Department (HOSPITAL_COMMUNITY): Payer: BC Managed Care – PPO

## 2014-03-04 DIAGNOSIS — Z791 Long term (current) use of non-steroidal anti-inflammatories (NSAID): Secondary | ICD-10-CM | POA: Insufficient documentation

## 2014-03-04 DIAGNOSIS — R110 Nausea: Secondary | ICD-10-CM | POA: Insufficient documentation

## 2014-03-04 DIAGNOSIS — J029 Acute pharyngitis, unspecified: Secondary | ICD-10-CM | POA: Insufficient documentation

## 2014-03-04 DIAGNOSIS — R079 Chest pain, unspecified: Secondary | ICD-10-CM | POA: Insufficient documentation

## 2014-03-04 DIAGNOSIS — Z792 Long term (current) use of antibiotics: Secondary | ICD-10-CM | POA: Insufficient documentation

## 2014-03-04 DIAGNOSIS — Z79899 Other long term (current) drug therapy: Secondary | ICD-10-CM | POA: Insufficient documentation

## 2014-03-04 DIAGNOSIS — R51 Headache: Secondary | ICD-10-CM | POA: Insufficient documentation

## 2014-03-04 DIAGNOSIS — R519 Headache, unspecified: Secondary | ICD-10-CM

## 2014-03-04 DIAGNOSIS — H539 Unspecified visual disturbance: Secondary | ICD-10-CM | POA: Insufficient documentation

## 2014-03-04 HISTORY — DX: Type 2 diabetes mellitus without complications: E11.9

## 2014-03-04 LAB — CBC WITH DIFFERENTIAL/PLATELET
Basophils Absolute: 0.1 10*3/uL (ref 0.0–0.1)
Basophils Relative: 1 % (ref 0–1)
Eosinophils Absolute: 0.2 10*3/uL (ref 0.0–0.7)
Eosinophils Relative: 2 % (ref 0–5)
HCT: 39.9 % (ref 36.0–46.0)
HEMOGLOBIN: 13 g/dL (ref 12.0–15.0)
LYMPHS ABS: 2.9 10*3/uL (ref 0.7–4.0)
Lymphocytes Relative: 34 % (ref 12–46)
MCH: 27.7 pg (ref 26.0–34.0)
MCHC: 32.6 g/dL (ref 30.0–36.0)
MCV: 85.1 fL (ref 78.0–100.0)
MONOS PCT: 6 % (ref 3–12)
Monocytes Absolute: 0.5 10*3/uL (ref 0.1–1.0)
NEUTROS ABS: 5 10*3/uL (ref 1.7–7.7)
NEUTROS PCT: 57 % (ref 43–77)
Platelets: 410 10*3/uL — ABNORMAL HIGH (ref 150–400)
RBC: 4.69 MIL/uL (ref 3.87–5.11)
RDW: 13.6 % (ref 11.5–15.5)
WBC: 8.6 10*3/uL (ref 4.0–10.5)

## 2014-03-04 LAB — BASIC METABOLIC PANEL
BUN: 13 mg/dL (ref 6–23)
CHLORIDE: 99 meq/L (ref 96–112)
CO2: 24 mEq/L (ref 19–32)
Calcium: 9 mg/dL (ref 8.4–10.5)
Creatinine, Ser: 0.67 mg/dL (ref 0.50–1.10)
GFR calc Af Amer: >90 mL/min (ref >90)
GFR calc non Af Amer: >90 mL/min (ref >90)
GLUCOSE: 117 mg/dL — AB (ref 70–99)
POTASSIUM: 3.9 meq/L (ref 3.7–5.3)
Sodium: 138 mEq/L (ref 137–147)

## 2014-03-04 LAB — TROPONIN I: Troponin I: <0.3 ng/mL (ref <0.30)

## 2014-03-04 MED ORDER — DEXAMETHASONE 4 MG PO TABS
12.0000 mg | ORAL_TABLET | Freq: Once | ORAL | Status: AC
  Administered 2014-03-05: 12 mg via ORAL
  Filled 2014-03-04: qty 3

## 2014-03-04 MED ORDER — IBUPROFEN 800 MG PO TABS
800.0000 mg | ORAL_TABLET | Freq: Once | ORAL | Status: AC
  Administered 2014-03-05: 800 mg via ORAL
  Filled 2014-03-04: qty 1

## 2014-03-04 NOTE — ED Notes (Signed)
Sudden onset of sharp upper Left chest pain began today at 1515.  Associated lightheadedness.  R occipital pain radiating to shoulder and between scapulae.

## 2014-03-04 NOTE — ED Provider Notes (Signed)
CSN: [id number]     Arrival date & time 03/04/14  2025 History  This chart was scribed for Dione Booze, MD by Danella Maiers, ED Scribe. This patient was seen in room APA03/[facility]03 and the patient's care was started at 11:26 PM.    Chief Complaint  Patient presents with  . Chest Pain  . Extremity Weakness   The history is provided by the patient. No language interpreter was used.   HPI Comments: [name] is a 43 y.o. female who presents to the Emergency Department complaining of sudden-onset, sharp left upper CP that started today around 3:15pm that has now resolved. She rates the severity of the pain as a 10/10 at its worst. She reports she had associated blurred vision in her left eye. Nothing made the CP better or worse. She states at the same time that her chest started hurting she had pain from her left face down to her left shoulder. The face and CP have now resolved but she is still having a mild sore throat. She reports nausea but no vomiting. She did not take any OTC medications.    Past Medical History  Diagnosis Date  . Diabetes mellitus without complication     boderline   Past Surgical History  Procedure Laterality Date  . Abdominal hysterectomy    . Tonsillectomy    . Colon surgery     History reviewed. No pertinent family history. History  Substance Use Topics  . Smoking status: Never Smoker   . Smokeless tobacco: Not on file  . Alcohol Use: No   OB History   Grav Para Term Preterm Abortions TAB SAB Ect Mult Living                 Review of Systems  HENT: Positive for sore throat.   Eyes: Positive for visual disturbance.  Cardiovascular: Positive for chest pain.  Gastrointestinal: Positive for nausea. Negative for vomiting.  All other systems reviewed and are negative.     Allergies  Review of patient's allergies indicates no known allergies.  Home Medications   Prior to Admission medications   Medication Sig Start Date End Date Taking? Authorizing  Provider  acetaminophen (TYLENOL) 500 MG tablet Take 1,000 mg by mouth daily as needed. For pain    Historical Provider, MD  amoxicillin (AMOXIL) 500 MG capsule Take 1 capsule (500 mg total) by mouth 3 (three) times daily. 08/18/12   Kathie Dike, PA-C  HYDROcodone-acetaminophen (NORCO) 5-325 MG per tablet Take 1 tablet by mouth every 4 (four) hours as needed for pain. 08/18/12   Kathie Dike, PA-C  meloxicam (MOBIC) 7.5 MG tablet 1 po bid with food 08/18/12   Kathie Dike, PA-C   BP 205/99  Pulse 78  Temp(Src) 98 F (36.7 C) (Oral)  Resp 20  Ht 5\' 3"  (1.6 m)  Wt 205 lb (92.987 kg)  BMI 36.32 kg/m2  SpO2 100% Physical Exam  Nursing note and vitals reviewed. Constitutional: She is oriented to person, place, and time. She appears well-developed and well-nourished. No distress.  HENT:  Head: Normocephalic and atraumatic.  Mild edema of the uvula  Eyes: Conjunctivae and EOM are normal. Pupils are equal, round, and reactive to light.  Fundi are normal  Neck: Normal range of motion. No JVD present. No tracheal deviation present.  Cardiovascular: Normal rate and regular rhythm.   No murmur heard. Pulmonary/Chest: Effort normal. No respiratory distress. She exhibits tenderness (Moderate tenderness left parasternal).  Abdominal: Soft.  Bowel sounds are normal. She exhibits no mass. There is no tenderness.  Musculoskeletal: Normal range of motion. She exhibits no edema.  Lymphadenopathy:    She has no cervical adenopathy.  Neurological: She is alert and oriented to person, place, and time. She has normal reflexes. No cranial nerve deficit. She exhibits normal muscle tone. Coordination normal.  Skin: Skin is warm and dry.  Psychiatric: She has a normal mood and affect. Her behavior is normal. Thought content normal.    ED Course  Procedures (including critical care time) Medications - No data to display  DIAGNOSTIC STUDIES: Oxygen Saturation is 100% on RA, normal by my  interpretation.    COORDINATION OF CARE: 11:34 PM- Discussed treatment plan with pt which includes rapid strep, ibuprofen, and decadron. Let her know x-rays labs and EKG were normal. Pt agrees to plan.    Labs Review Results for orders placed during the hospital encounter of 03/04/14  RAPID STREP SCREEN      Result Value Ref Range   Streptococcus, Group A Screen (Direct) NEGATIVE  NEGATIVE  CBC WITH DIFFERENTIAL      Result Value Ref Range   WBC 8.6  4.0 - 10.5 K/uL   RBC 4.69  3.87 - 5.11 MIL/uL   Hemoglobin 13.0  12.0 - 15.0 g/dL   HCT 44.039.9  10.236.0 - 72.546.0 %   MCV 85.1  78.0 - 100.0 fL   MCH 27.7  26.0 - 34.0 pg   MCHC 32.6  30.0 - 36.0 g/dL   RDW 36.613.6  44.011.5 - 34.715.5 %   Platelets 410 (*) 150 - 400 K/uL   Neutrophils Relative % 57  43 - 77 %   Neutro Abs 5.0  1.7 - 7.7 K/uL   Lymphocytes Relative 34  12 - 46 %   Lymphs Abs 2.9  0.7 - 4.0 K/uL   Monocytes Relative 6  3 - 12 %   Monocytes Absolute 0.5  0.1 - 1.0 K/uL   Eosinophils Relative 2  0 - 5 %   Eosinophils Absolute 0.2  0.0 - 0.7 K/uL   Basophils Relative 1  0 - 1 %   Basophils Absolute 0.1  0.0 - 0.1 K/uL  BASIC METABOLIC PANEL      Result Value Ref Range   Sodium 138  137 - 147 mEq/L   Potassium 3.9  3.7 - 5.3 mEq/L   Chloride 99  96 - 112 mEq/L   CO2 24  19 - 32 mEq/L   Glucose, Bld 117 (*) 70 - 99 mg/dL   BUN 13  6 - 23 mg/dL   Creatinine, Ser 4.250.67  0.50 - 1.10 mg/dL   Calcium 9.0  8.4 - 95.610.5 mg/dL   GFR calc non Af Amer >90  >90 mL/min   GFR calc Af Amer >90  >90 mL/min  TROPONIN I      Result Value Ref Range   Troponin I <0.30  <0.30 ng/mL   Imaging Review Dg Chest 2 View  03/04/2014   CLINICAL DATA:  6 hr of sharp left chest pain with extremity weakness  EXAM: CHEST  2 VIEW  COMPARISON:  PA and lateral chest x-ray of November 22, 2010  FINDINGS: The lungs are adequately inflated and clear. The heart and mediastinal structures are normal. There is no pleural effusion or pneumothorax. The bony thorax is  unremarkable.  IMPRESSION: There is no active cardiopulmonary disease.   Electronically Signed   By: Shaconda Hajduk  [place]   On:  03/04/2014 20:55     EKG Interpretation   Date/Time:  Friday March 04 2014 20:32:35 EDT Ventricular Rate:  77 PR Interval:  150 QRS Duration: 94 QT Interval:  364 QTC Calculation: 411 R Axis:   58 Text Interpretation:  Normal sinus rhythm Normal ECG No old tracing to  compare Confirmed by [facility]  MD, Drucella Karbowski ([id number]) on 03/04/2014 11:24:53 PM      MDM   Final diagnoses:  Chest pain  Headache  Sore throat    Chest pain and headache which have resolved. Sore throat which could conceivably be a streptococcal. Strep screen will be obtained. She is given a dose of dexamethasone empirically. There is some chest wall tenderness and this medicine would be appropriate for her costochondritis as well.  ECG and chest x-ray unremarkable and laboratory workup is normal. Strep screen has come back negative. She is discharged with instructions to use over-the-counter analgesics as needed and return should symptoms worsen.  I personally performed the services described in this documentation, which was scribed in my presence. The recorded information has been reviewed and is accurate.     Dione Booze, MD 03/05/14 [phone]

## 2014-03-05 LAB — RAPID STREP SCREEN (MED CTR MEBANE ONLY): STREPTOCOCCUS, GROUP A SCREEN (DIRECT): NEGATIVE

## 2014-03-05 NOTE — Discharge Instructions (Signed)
Your evaluation in the emergency department was unremarkable. He did get a dose of dexamethasone which will help a lot of different things. The single dose will last for about 3 days. Please return to the emergency department if symptoms are worsening. Otherwise, followup with her PCP. You may take over-the-counter medications such as acetaminophen and ibuprofen as needed.

## 2014-03-07 LAB — CULTURE, GROUP A STREP

## 2015-05-18 ENCOUNTER — Emergency Department (HOSPITAL_COMMUNITY): Payer: BLUE CROSS/BLUE SHIELD

## 2015-05-18 ENCOUNTER — Emergency Department (HOSPITAL_COMMUNITY)
Admission: EM | Admit: 2015-05-18 | Discharge: 2015-05-18 | Disposition: A | Discharge status: Home / Self Care | Payer: BLUE CROSS/BLUE SHIELD | Attending: Emergency Medicine | Admitting: Emergency Medicine

## 2015-05-18 ENCOUNTER — Encounter (HOSPITAL_COMMUNITY): Payer: Self-pay | Admitting: Emergency Medicine

## 2015-05-18 DIAGNOSIS — E669 Obesity, unspecified: Secondary | ICD-10-CM | POA: Diagnosis not present

## 2015-05-18 DIAGNOSIS — R079 Chest pain, unspecified: Secondary | ICD-10-CM | POA: Diagnosis not present

## 2015-05-18 DIAGNOSIS — Z79899 Other long term (current) drug therapy: Secondary | ICD-10-CM | POA: Insufficient documentation

## 2015-05-18 DIAGNOSIS — Z7982 Long term (current) use of aspirin: Secondary | ICD-10-CM | POA: Diagnosis not present

## 2015-05-18 LAB — BASIC METABOLIC PANEL
Anion gap: 5 (ref 5–15)
BUN: 15 mg/dL (ref 6–20)
CO2: 29 mmol/L (ref 22–32)
CREATININE: 0.84 mg/dL (ref 0.44–1.00)
Calcium: 8.5 mg/dL — ABNORMAL LOW (ref 8.9–10.3)
Chloride: 105 mmol/L (ref 101–111)
GFR calc Af Amer: >60 mL/min (ref >60)
GFR calc non Af Amer: >60 mL/min (ref >60)
Glucose, Bld: 103 mg/dL — ABNORMAL HIGH (ref 65–99)
Potassium: 4 mmol/L (ref 3.5–5.1)
Sodium: 139 mmol/L (ref 135–145)

## 2015-05-18 LAB — TROPONIN I: Troponin I: <0.03 ng/mL (ref <0.031)

## 2015-05-18 LAB — CBC
HCT: 40.6 % (ref 36.0–46.0)
Hemoglobin: 13 g/dL (ref 12.0–15.0)
MCH: 27.7 pg (ref 26.0–34.0)
MCHC: 32 g/dL (ref 30.0–36.0)
MCV: 86.4 fL (ref 78.0–100.0)
PLATELETS: 384 10*3/uL (ref 150–400)
RBC: 4.7 MIL/uL (ref 3.87–5.11)
RDW: 13.1 % (ref 11.5–15.5)
WBC: 5.7 10*3/uL (ref 4.0–10.5)

## 2015-05-18 NOTE — ED Notes (Signed)
Pt states that she started having chest pain about an hour ago.  EMS reports hypertension.  Pt took Aspirin pta but not sure how much.  States it was 3 tablets.

## 2015-05-18 NOTE — ED Notes (Signed)
Pt made aware to return if symptoms worsen or if any life threatening symptoms occur.   

## 2015-05-18 NOTE — ED Notes (Signed)
Dr Cook at bedside

## 2015-05-18 NOTE — ED Notes (Signed)
Updated dr. Adriana Simas that pt would like to go home.

## 2015-05-18 NOTE — Discharge Instructions (Signed)
Chest Pain (Nonspecific) It is often hard to give a diagnosis for the cause of chest pain. There is always a chance that your pain could be related to something serious, such as a heart attack or a blood clot in the lungs. You need to follow up with your doctor. HOME CARE  If antibiotic medicine was given, take it as directed by your doctor. Finish the medicine even if you start to feel better.  For the next few days, avoid activities that bring on chest pain. Continue physical activities as told by your doctor.  Do not use any tobacco products. This includes cigarettes, chewing tobacco, and e-cigarettes.  Avoid drinking alcohol.  Only take medicine as told by your doctor.  Follow your doctor's suggestions for more testing if your chest pain does not go away.  Keep all doctor visits you made. GET HELP IF:  Your chest pain does not go away, even after treatment.  You have a rash with blisters on your chest.  You have a fever. GET HELP RIGHT AWAY IF:   You have more pain or pain that spreads to your arm, neck, jaw, back, or belly (abdomen).  You have shortness of breath.  You cough more than usual or cough up blood.  You have very bad back or belly pain.  You feel sick to your stomach (nauseous) or throw up (vomit).  You have very bad weakness.  You pass out (faint).  You have chills. This is an emergency. Do not wait to see if the problems will go away. Call your local emergency services (911 in [place] Do not drive yourself to the hospital. MAKE SURE YOU:   Understand these instructions.  Will watch your condition.  Will get help right away if you are not doing well or get worse. Document Released: 03/04/2008 Document Revised: 09/21/2013 Document Reviewed: 03/04/2008 [facility] Patient Information 2015 [place], [place]. This information is not intended to replace advice given to you by your health care provider. Make sure you discuss any questions you have with your  health care provider.   Follow-up your primary care doctor. Please make a log of your blood pressure. Return if worse.

## 2015-05-18 NOTE — ED Provider Notes (Signed)
CSN: [id number]     Arrival date & time 05/18/15  1308 History  This chart was scribed for Donnetta Hutching, MD by Elon Spanner, ED Scribe. This patient was seen in room APA19/APA19 and the patient's care was started at 9:12 AM.   Chief Complaint  Patient presents with  . Chest Pain   The history is provided by the patient. No language interpreter was used.   HPI Comments: [name] is a 44 y.o. 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  Associated symptoms include diaphoresis.  After onset, the patient took 3 baby ASA and her BP was measured at 218/136 at work.  Family hx of CABG in father at 4.  She is currently prescribed daily phenteramine for the past 2 months for weight loss.    Past Medical History  Diagnosis Date  . Diabetes mellitus without complication     boderline   Past Surgical History  Procedure Laterality Date  . Abdominal hysterectomy    . Tonsillectomy    . Colon surgery      colonoscopy   History reviewed. No pertinent family history. Social History  Substance Use Topics  . Smoking status: Never Smoker   . Smokeless tobacco: None  . Alcohol Use: No   OB History    No data available     Review of Systems A complete 10 system review of systems was obtained and all systems are negative except as noted in the HPI and PMH.   Allergies  Review of patient's allergies indicates no known allergies.  Home Medications   Prior to Admission medications   Medication Sig Start Date End Date Taking? Authorizing Provider  aspirin 81 MG tablet Take 81 mg by mouth daily.   Yes Historical Provider, MD  cholecalciferol (VITAMIN D) 1000 UNITS tablet Take 1,000 Units by mouth daily.   Yes Historical Provider, MD  LYSINE HCL PO Take 1 tablet by mouth daily.   Yes Historical Provider, MD  HYDROcodone-acetaminophen  (NORCO/VICODIN) 5-325 MG per tablet Take one  tab po q 4-6 hrs prn pain 05/21/15   Tammy Triplett, PA-C  lisinopril (PRINIVIL,ZESTRIL) 5 MG tablet Take 5 mg by mouth daily.    Historical Provider, MD  ondansetron (ZOFRAN) 4 MG tablet Take 1 tablet (4 mg total) by mouth every 6 (six) hours. 05/21/15   Tammy Triplett, PA-C   BP 143/83 mmHg  Pulse 73  Temp(Src) 97.6 F (36.4 C) (Oral)  Resp 25  Ht 5\' 3"  (1.6 m)  Wt 197 lb (89.359 kg)  BMI 34.91 kg/m2  SpO2 98% Physical Exam  Constitutional: She is oriented to person, place, and time. She appears well-developed and well-nourished.  Obese.    HENT:  Head: Normocephalic and atraumatic.  Eyes: Conjunctivae and EOM are normal. Pupils are equal, round, and reactive to light.  Neck: Normal range of motion. Neck supple.  Cardiovascular: Normal rate and regular rhythm.   Pulmonary/Chest: Effort normal and breath sounds normal.  Abdominal: Soft. Bowel sounds are normal.  Musculoskeletal: Normal range of motion.  Neurological: She is alert and oriented to person, place, and time.  Skin: Skin is warm and dry.  Psychiatric: She has a normal mood and affect. Her behavior is normal.  Nursing note and vitals reviewed.   ED Course  Procedures (including critical care time)  DIAGNOSTIC STUDIES: Oxygen Saturation is 100% on  RA, normal by my interpretation.    COORDINATION OF CARE:  9:18 AM Will order ECG, imaging, labs.  Patient acknowledges and agrees with plan.    Labs Review Labs Reviewed  BASIC METABOLIC PANEL - Abnormal; Notable for the following:    Glucose, Bld 103 (*)    Calcium 8.5 (*)    All other components within normal limits  CBC  TROPONIN I    Imaging Review No results found. I have personally reviewed and evaluated these images and lab results as part of my medical decision-making.   EKG Interpretation   Date/Time:  Thursday May 18 2015 09:02:53 EDT Ventricular Rate:  76 PR Interval:  134 QRS Duration: 99 QT  Interval:  388 QTC Calculation: 436 R Axis:   26 Text Interpretation:  Sinus rhythm Abnormal R-wave progression, late  transition Confirmed by Azelea Seguin  MD, Brynna Dobos ([id number]) on 05/18/2015 10:33:41 AM      MDM   Final diagnoses:  Chest pain, unspecified chest pain type    Blood pressure has reduced. Screening tests normal including EKG, labs, troponin, cxr.  Patient has primary care follow-up.  I personally performed the services described in this documentation, which was scribed in my presence. The recorded information has been reviewed and is accurate.      Donnetta Hutching, MD 05/24/15 [phone]

## 2015-05-21 ENCOUNTER — Emergency Department (HOSPITAL_COMMUNITY)
Admission: EM | Admit: 2015-05-21 | Discharge: 2015-05-21 | Disposition: A | Discharge status: Home / Self Care | Payer: BLUE CROSS/BLUE SHIELD | Attending: Emergency Medicine | Admitting: Emergency Medicine

## 2015-05-21 ENCOUNTER — Emergency Department (HOSPITAL_COMMUNITY): Payer: BLUE CROSS/BLUE SHIELD

## 2015-05-21 ENCOUNTER — Encounter (HOSPITAL_COMMUNITY): Payer: Self-pay | Admitting: Emergency Medicine

## 2015-05-21 DIAGNOSIS — Z79899 Other long term (current) drug therapy: Secondary | ICD-10-CM | POA: Insufficient documentation

## 2015-05-21 DIAGNOSIS — Z7982 Long term (current) use of aspirin: Secondary | ICD-10-CM | POA: Insufficient documentation

## 2015-05-21 DIAGNOSIS — R1110 Vomiting, unspecified: Secondary | ICD-10-CM

## 2015-05-21 DIAGNOSIS — R112 Nausea with vomiting, unspecified: Secondary | ICD-10-CM | POA: Diagnosis not present

## 2015-05-21 DIAGNOSIS — R1084 Generalized abdominal pain: Secondary | ICD-10-CM | POA: Insufficient documentation

## 2015-05-21 DIAGNOSIS — R197 Diarrhea, unspecified: Secondary | ICD-10-CM | POA: Insufficient documentation

## 2015-05-21 DIAGNOSIS — R109 Unspecified abdominal pain: Secondary | ICD-10-CM

## 2015-05-21 LAB — COMPREHENSIVE METABOLIC PANEL
ALT: 27 U/L (ref 14–54)
AST: 21 U/L (ref 15–41)
Albumin: 4.5 g/dL (ref 3.5–5.0)
Alkaline Phosphatase: 119 U/L (ref 38–126)
Anion gap: 9 (ref 5–15)
BILIRUBIN TOTAL: 0.5 mg/dL (ref 0.3–1.2)
BUN: 21 mg/dL — AB (ref 6–20)
CO2: 26 mmol/L (ref 22–32)
Calcium: 9.3 mg/dL (ref 8.9–10.3)
Chloride: 102 mmol/L (ref 101–111)
Creatinine, Ser: 0.76 mg/dL (ref 0.44–1.00)
Glucose, Bld: 128 mg/dL — ABNORMAL HIGH (ref 65–99)
POTASSIUM: 4.3 mmol/L (ref 3.5–5.1)
Sodium: 137 mmol/L (ref 135–145)
TOTAL PROTEIN: 8.8 g/dL — AB (ref 6.5–8.1)

## 2015-05-21 LAB — URINALYSIS, ROUTINE W REFLEX MICROSCOPIC
BILIRUBIN URINE: NEGATIVE
Glucose, UA: NEGATIVE mg/dL
Hgb urine dipstick: NEGATIVE
KETONES UR: NEGATIVE mg/dL
Leukocytes, UA: NEGATIVE
NITRITE: NEGATIVE
Protein, ur: NEGATIVE mg/dL
Specific Gravity, Urine: >1.03 — ABNORMAL HIGH (ref 1.005–1.030)
Urobilinogen, UA: 0.2 mg/dL (ref 0.0–1.0)
pH: 5.5 (ref 5.0–8.0)

## 2015-05-21 LAB — LIPASE, BLOOD: LIPASE: 14 U/L — AB (ref 22–51)

## 2015-05-21 LAB — CBC WITH DIFFERENTIAL/PLATELET
Basophils Absolute: 0 10*3/uL (ref 0.0–0.1)
Basophils Relative: 0 % (ref 0–1)
EOS ABS: 0.1 10*3/uL (ref 0.0–0.7)
EOS PCT: 2 % (ref 0–5)
HCT: 47.9 % — ABNORMAL HIGH (ref 36.0–46.0)
Hemoglobin: 15.9 g/dL — ABNORMAL HIGH (ref 12.0–15.0)
Lymphocytes Relative: 19 % (ref 12–46)
Lymphs Abs: 1.5 10*3/uL (ref 0.7–4.0)
MCH: 28.8 pg (ref 26.0–34.0)
MCHC: 33.2 g/dL (ref 30.0–36.0)
MCV: 86.8 fL (ref 78.0–100.0)
Monocytes Absolute: 0.4 10*3/uL (ref 0.1–1.0)
Monocytes Relative: 5 % (ref 3–12)
Neutro Abs: 5.8 10*3/uL (ref 1.7–7.7)
Neutrophils Relative %: 74 % (ref 43–77)
PLATELETS: 399 10*3/uL (ref 150–400)
RBC: 5.52 MIL/uL — AB (ref 3.87–5.11)
RDW: 13.1 % (ref 11.5–15.5)
WBC: 7.9 10*3/uL (ref 4.0–10.5)

## 2015-05-21 MED ORDER — OXYCODONE-ACETAMINOPHEN 5-325 MG PO TABS
1.0000 | ORAL_TABLET | Freq: Once | ORAL | Status: AC
  Administered 2015-05-21: 1 via ORAL
  Filled 2015-05-21: qty 1

## 2015-05-21 MED ORDER — ONDANSETRON HCL 4 MG/2ML IJ SOLN
4.0000 mg | Freq: Once | INTRAMUSCULAR | Status: AC
  Administered 2015-05-21: 4 mg via INTRAVENOUS
  Filled 2015-05-21: qty 2

## 2015-05-21 MED ORDER — IOHEXOL 300 MG/ML  SOLN
25.0000 mL | Freq: Once | INTRAMUSCULAR | Status: AC | PRN
  Administered 2015-05-21: 25 mL via ORAL

## 2015-05-21 MED ORDER — METOCLOPRAMIDE HCL 5 MG/ML IJ SOLN
10.0000 mg | Freq: Once | INTRAMUSCULAR | Status: AC
  Administered 2015-05-21: 10 mg via INTRAVENOUS
  Filled 2015-05-21: qty 2

## 2015-05-21 MED ORDER — HYDROCODONE-ACETAMINOPHEN 5-325 MG PO TABS: ORAL_TABLET | ORAL | Status: DC

## 2015-05-21 MED ORDER — SODIUM CHLORIDE 0.9 % IV BOLUS (SEPSIS)
1000.0000 mL | Freq: Once | INTRAVENOUS | Status: AC
  Administered 2015-05-21: 1000 mL via INTRAVENOUS

## 2015-05-21 MED ORDER — IOHEXOL 300 MG/ML  SOLN
100.0000 mL | Freq: Once | INTRAMUSCULAR | Status: AC | PRN
  Administered 2015-05-21: 100 mL via INTRAVENOUS

## 2015-05-21 MED ORDER — ONDANSETRON HCL 4 MG PO TABS: 4.0000 mg | ORAL_TABLET | Freq: Four times a day (QID) | ORAL | Status: AC

## 2015-05-21 NOTE — ED Provider Notes (Signed)
CSN: [id number]     Arrival date & time 05/21/15  0801 History   First MD Initiated Contact with Patient 05/21/15 [phone]     Chief Complaint  Patient presents with  . Nausea     (Consider location/radiation/quality/duration/timing/severity/associated sxs/prior Treatment) HPI  [name] is a 44 y.o. female who presents to the Emergency Department complaining of sudden onset of nausea, vomiting and diarrhea that two days ago.  She reports multiple episodes of diarrhea that she describes as watery and bright green in color initially and now has become darker.  She has three episodes of vomiting yesterday and this morning states that her vomitus was black to dark brown in color and "looked like feces."  She reports eating [place] food earlier on the evening that her symptoms began.  She also complains of sharp pains to her middle to upper abdomen.  Symptoms are worsened by food and liquids, nothing makes her symptoms better.  She denies bloody stools, fever, recent antibiotic use, chest pain, shortness of breath or dysuria.    Past Medical History  Diagnosis Date  . Diabetes mellitus without complication     boderline   Past Surgical History  Procedure Laterality Date  . Abdominal hysterectomy    . Tonsillectomy    . Colon surgery      colonoscopy   No family history on file. Social History  Substance Use Topics  . Smoking status: Never Smoker   . Smokeless tobacco: None  . Alcohol Use: No   OB History    No data available     Review of Systems  Constitutional: Positive for appetite change. Negative for fever, chills and activity change.  HENT: Negative for trouble swallowing.   Respiratory: Negative for chest tightness and shortness of breath.   Cardiovascular: Negative for chest pain.  Gastrointestinal: Positive for nausea, vomiting, abdominal pain and diarrhea. Negative for blood in stool and abdominal distention.  Genitourinary: Negative for dysuria, flank pain, decreased  urine volume and difficulty urinating.  Musculoskeletal: Negative for myalgias and back pain.  Skin: Negative for color change and rash.  Neurological: Negative for dizziness, weakness and numbness.  Hematological: Negative for adenopathy.  All other systems reviewed and are negative.     Allergies  Review of patient's allergies indicates no known allergies.  Home Medications   Prior to Admission medications   Medication Sig Start Date End Date Taking? Authorizing Provider  aspirin 81 MG tablet Take 81 mg by mouth daily.    Historical Provider, MD  cholecalciferol (VITAMIN D) 1000 UNITS tablet Take 1,000 Units by mouth daily.    Historical Provider, MD  hydrOXYzine (VISTARIL) 25 MG capsule Take 25 mg by mouth 3 (three) times daily. 01/20/14   Historical Provider, MD  LYSINE HCL PO Take 1 tablet by mouth daily.    Historical Provider, MD   BP 158/103 mmHg  Pulse 101  Temp(Src) 97.9 F (36.6 C) (Oral)  Resp 15  Ht 5\' 3"  (1.6 m)  Wt 194 lb (87.998 kg)  BMI 34.37 kg/m2  SpO2 99% Physical Exam  Constitutional: She is oriented to person, place, and time. She appears well-developed and well-nourished. No distress.  HENT:  Head: Normocephalic and atraumatic.  Mouth/Throat: Oropharynx is clear and moist.  Cardiovascular: Normal rate, regular rhythm, normal heart sounds and intact distal pulses.   No murmur heard. Pulmonary/Chest: Effort normal and breath sounds normal. No respiratory distress.  Abdominal: Soft. Normal appearance and bowel sounds are normal. She exhibits no  distension and no mass. There is tenderness. There is no rebound, no guarding and no CVA tenderness.    Diffuse mid abdominal tenderness.  No guarding or reboundness  Musculoskeletal: Normal range of motion. She exhibits no edema.  Neurological: She is alert and oriented to person, place, and time. She exhibits normal muscle tone. Coordination normal.  Skin: Skin is warm and dry.  Nursing note and vitals  reviewed.   ED Course  Procedures (including critical care time) Labs Review Labs Reviewed  COMPREHENSIVE METABOLIC PANEL - Abnormal; Notable for the following:    Glucose, Bld 128 (*)    BUN 21 (*)    Total Protein 8.8 (*)    All other components within normal limits  CBC WITH DIFFERENTIAL/PLATELET - Abnormal; Notable for the following:    RBC 5.52 (*)    Hemoglobin 15.9 (*)    HCT 47.9 (*)    All other components within normal limits  LIPASE, BLOOD - Abnormal; Notable for the following:    Lipase 14 (*)    All other components within normal limits  URINALYSIS, ROUTINE W REFLEX MICROSCOPIC (NOT AT [facility]) - Abnormal; Notable for the following:    APPearance HAZY (*)    Specific Gravity, Urine >1.030 (*)    All other components within normal limits    Imaging Review Dg Chest 2 View  05/18/2015   CLINICAL DATA:  Acute onset right side chest pain and dizziness this morning.  EXAM: CHEST  2 VIEW  COMPARISON:  PA and lateral chest 03/04/2014 and 12/21/2010.  FINDINGS: The lungs are clear. Heart size is normal. No pneumothorax or pleural effusion.  IMPRESSION: Negative chest.   Electronically Signed   By: Drusilla Kanner M.D.   On: 05/18/2015 09:56   Ct Abdomen Pelvis W Contrast  05/21/2015   CLINICAL DATA:  Generalized abdominal pain, vomiting, and diarrhea for 3 days.  EXAM: CT ABDOMEN AND PELVIS WITH CONTRAST  TECHNIQUE: Multidetector CT imaging of the abdomen and pelvis was performed using the standard protocol following bolus administration of intravenous contrast.  CONTRAST:  OMNIPAQUE IOHEXOL 300 MG/ML  SOLN  COMPARISON:  02/21/2012  FINDINGS: Lower Chest: No acute findings.  Hepatobiliary: No masses or other significant abnormality identified. Gallbladder is unremarkable.  Pancreas: No mass, inflammatory changes, or other significant abnormality identified.  Spleen:  Within normal limits in size and appearance.  Adrenals:  No masses identified.  Kidneys/Urinary Tract: No  evidence of masses or hydronephrosis. Tiny subcapsular left renal cyst has nearly completely resolved since prior study.  Stomach/Bowel/Peritoneum: No evidence of wall thickening, mass, or obstruction.  Vascular/Lymphatic: No pathologically enlarged lymph nodes identified. No abdominal aortic aneurysm or other significant retroperitoneal abnormality demonstrated.  Reproductive: Prior hysterectomy noted. Adnexal regions are unremarkable in appearance.  Other:  None.  Musculoskeletal:  No suspicious bone lesions identified.  IMPRESSION: No acute findings or other significant abnormality identified within the abdomen or pelvis.   Electronically Signed   By: Myles Rosenthal M.D.   On: 05/21/2015 14:39    I have personally reviewed and evaluated these images and lab results as part of my medical decision-making.   EKG Interpretation None      MDM   Final diagnoses:  Abdominal pain, vomiting, and diarrhea    Pt seen here on 05/18/15 for chest pain, no chest pain at present.  She is well appearing and non-toxic.  Vitals stable.     1500  Consulted Dr. Eppie Gibson regarding appendix.  He noted that  appendix was not visualized, but no inflammatory changes were seen  Pt is feeling better,  Vitals remain stable and she agrees to close f/u with her PMD.  Appears stable for d/c.  Strict return precautions given.      Pauline Aus, PA-C 05/23/15 2057  Samuel Jester, DO 05/24/15 1525

## 2015-05-21 NOTE — Discharge Instructions (Signed)
Food Choices to Help Relieve Diarrhea °When you have diarrhea, the foods you eat and your eating habits are very important. Choosing the right foods and drinks can help relieve diarrhea. Also, because diarrhea can last up to 7 days, you need to replace lost fluids and electrolytes (such as sodium, potassium, and chloride) in order to help prevent dehydration.  °WHAT GENERAL GUIDELINES DO I NEED TO FOLLOW? °· Slowly drink 1 cup (8 oz) of fluid for each episode of diarrhea. If you are getting enough fluid, your urine will be clear or pale yellow. °· Eat starchy foods. Some good choices include white rice, white toast, pasta, low-fiber cereal, baked potatoes (without the skin), saltine crackers, and bagels. °· Avoid large servings of any cooked vegetables. °· Limit fruit to two servings per day. A serving is ½ cup or 1 small piece. °· Choose foods with less than 2 g of fiber per serving. °· Limit fats to less than 8 tsp (38 g) per day. °· Avoid fried foods. °· Eat foods that have probiotics in them. Probiotics can be found in certain dairy products. °· Avoid foods and beverages that may increase the speed at which food moves through the stomach and intestines (gastrointestinal tract). Things to avoid include: °¨ High-fiber foods, such as dried fruit, raw fruits and vegetables, nuts, seeds, and whole grain foods. °¨ Spicy foods and high-fat foods. °¨ Foods and beverages sweetened with high-fructose corn syrup, honey, or sugar alcohols such as xylitol, sorbitol, and mannitol. °WHAT FOODS ARE RECOMMENDED? °Grains °White rice. White, French, or pita breads (fresh or toasted), including plain rolls, buns, or bagels. White pasta. Saltine, soda, or graham crackers. Pretzels. Low-fiber cereal. Cooked cereals made with water (such as cornmeal, farina, or cream cereals). Plain muffins. Matzo. Melba toast. Zwieback.  °Vegetables °Potatoes (without the skin). Strained tomato and vegetable juices. Most well-cooked and canned  vegetables without seeds. Tender lettuce. °Fruits °Cooked or canned applesauce, apricots, cherries, fruit cocktail, grapefruit, peaches, pears, or plums. Fresh bananas, apples without skin, cherries, grapes, cantaloupe, grapefruit, peaches, oranges, or plums.  °Meat and Other Protein Products °Baked or boiled chicken. Eggs. Tofu. Fish. Seafood. Smooth peanut butter. Ground or well-cooked tender beef, ham, veal, lamb, pork, or poultry.  °Dairy °Plain yogurt, kefir, and unsweetened liquid yogurt. Lactose-free milk, buttermilk, or soy milk. Plain hard cheese. °Beverages °Sport drinks. Clear broths. Diluted fruit juices (except prune). Regular, caffeine-free sodas such as ginger ale. Water. Decaffeinated teas. Oral rehydration solutions. Sugar-free beverages not sweetened with sugar alcohols. °Other °Bouillon, broth, or soups made from recommended foods.  °The items listed above may not be a complete list of recommended foods or beverages. Contact your dietitian for more options. °WHAT FOODS ARE NOT RECOMMENDED? °Grains °Whole grain, whole wheat, bran, or rye breads, rolls, pastas, crackers, and cereals. Wild or brown rice. Cereals that contain more than 2 g of fiber per serving. Corn tortillas or taco shells. Cooked or dry oatmeal. Granola. Popcorn. °Vegetables °Raw vegetables. Cabbage, broccoli, Brussels sprouts, artichokes, baked beans, beet greens, corn, kale, legumes, peas, sweet potatoes, and yams. Potato skins. Cooked spinach and cabbage. °Fruits °Dried fruit, including raisins and dates. Raw fruits. Stewed or dried prunes. Fresh apples with skin, apricots, mangoes, pears, raspberries, and strawberries.  °Meat and Other Protein Products °Chunky peanut butter. Nuts and seeds. Beans and lentils. Bacon.  °Dairy °High-fat cheeses. Milk, chocolate milk, and beverages made with milk, such as milk shakes. Cream. Ice cream. °Sweets and Desserts °Sweet rolls, doughnuts, and sweet breads. Pancakes   and waffles. °Fats and  Oils °Butter. Cream sauces. Margarine. Salad oils. Plain salad dressings. Olives. Avocados.  °Beverages °Caffeinated beverages (such as coffee, tea, soda, or energy drinks). Alcoholic beverages. Fruit juices with pulp. Prune juice. Soft drinks sweetened with high-fructose corn syrup or sugar alcohols. °Other °Coconut. Hot sauce. Chili powder. Mayonnaise. Gravy. Cream-based or milk-based soups.  °The items listed above may not be a complete list of foods and beverages to avoid. Contact your dietitian for more information. °WHAT SHOULD I DO IF I BECOME DEHYDRATED? °Diarrhea can sometimes lead to dehydration. Signs of dehydration include dark urine and dry mouth and skin. If you think you are dehydrated, you should rehydrate with an oral rehydration solution. These solutions can be purchased at pharmacies, retail stores, or online.  °Drink ½-1 cup (120-240 mL) of oral rehydration solution each time you have an episode of diarrhea. If drinking this amount makes your diarrhea worse, try drinking smaller amounts more often. For example, drink 1-3 tsp (5-15 mL) every 5-10 minutes.  °A general rule for staying hydrated is to drink 1½-2 L of fluid per day. Talk to your health care provider about the specific amount you should be drinking each day. Drink enough fluids to keep your urine clear or pale yellow. °Document Released: 12/07/2003 Document Revised: 09/21/2013 Document Reviewed: 08/09/2013 °ExitCare® Patient Information ©2015 [employer]. This information is not intended to replace advice given to you by your health care provider. Make sure you discuss any questions you have with your health care provider. ° °Nausea and Vomiting °Nausea means you feel sick to your stomach. Throwing up (vomiting) is a reflex where stomach contents come out of your mouth. °HOME CARE  °· Take medicine as told by your doctor. °· Do not force yourself to eat. However, you do need to drink fluids. °· If you feel like eating, eat a normal  diet as told by your doctor. °¨ Eat rice, wheat, potatoes, bread, lean meats, yogurt, fruits, and vegetables. °¨ Avoid high-fat foods. °· Drink enough fluids to keep your pee (urine) clear or pale yellow. °· Ask your doctor how to replace body fluid losses (rehydrate). Signs of body fluid loss (dehydration) include: °¨ Feeling very thirsty. °¨ Dry lips and mouth. °¨ Feeling dizzy. °¨ Dark pee. °¨ Peeing less than normal. °¨ Feeling confused. °¨ Fast breathing or heart rate. °GET HELP RIGHT AWAY IF:  °· You have blood in your throw up. °· You have black or bloody poop (stool). °· You have a bad headache or stiff neck. °· You feel confused. °· You have bad belly (abdominal) pain. °· You have chest pain or trouble breathing. °· You do not pee at least once every 8 hours. °· You have cold, clammy skin. °· You keep throwing up after 24 to 48 hours. °· You have a fever. °MAKE SURE YOU:  °· Understand these instructions. °· Will watch your condition. °· Will get help right away if you are not doing well or get worse. °Document Released: 03/04/2008 Document Revised: 12/09/2011 Document Reviewed: 02/15/2011 °ExitCare® Patient Information ©2015 [employer]. This information is not intended to replace advice given to you by your health care provider. Make sure you discuss any questions you have with your health care provider. ° °

## 2015-05-21 NOTE — ED Notes (Signed)
Pt states has N/V/D since Friday night. Last emesis this morning, brown color

## 2017-10-28 ENCOUNTER — Ambulatory Visit: Payer: BLUE CROSS/BLUE SHIELD | Admitting: Orthopaedic Surgery

## 2021-02-09 ENCOUNTER — Emergency Department (HOSPITAL_COMMUNITY)
Admission: EM | Admit: 2021-02-09 | Discharge: 2021-02-10 | Disposition: A | Discharge status: Home / Self Care | Payer: BC Managed Care – PPO | Attending: Emergency Medicine | Admitting: Emergency Medicine

## 2021-02-09 ENCOUNTER — Encounter (HOSPITAL_COMMUNITY): Payer: Self-pay

## 2021-02-09 ENCOUNTER — Emergency Department (HOSPITAL_COMMUNITY): Payer: BC Managed Care – PPO

## 2021-02-09 ENCOUNTER — Other Ambulatory Visit: Payer: Self-pay

## 2021-02-09 DIAGNOSIS — Z79899 Other long term (current) drug therapy: Secondary | ICD-10-CM | POA: Diagnosis not present

## 2021-02-09 DIAGNOSIS — I10 Essential (primary) hypertension: Secondary | ICD-10-CM | POA: Insufficient documentation

## 2021-02-09 DIAGNOSIS — R1110 Vomiting, unspecified: Secondary | ICD-10-CM | POA: Insufficient documentation

## 2021-02-09 DIAGNOSIS — R1013 Epigastric pain: Secondary | ICD-10-CM | POA: Diagnosis present

## 2021-02-09 DIAGNOSIS — R112 Nausea with vomiting, unspecified: Secondary | ICD-10-CM

## 2021-02-09 DIAGNOSIS — Z7982 Long term (current) use of aspirin: Secondary | ICD-10-CM | POA: Insufficient documentation

## 2021-02-09 HISTORY — DX: Essential (primary) hypertension: I10

## 2021-02-09 LAB — CBC WITH DIFFERENTIAL/PLATELET
Abs Immature Granulocytes: 0.02 10*3/uL (ref 0.00–0.07)
Basophils Absolute: 0.1 10*3/uL (ref 0.0–0.1)
Basophils Relative: 1 %
Eosinophils Absolute: 0.2 10*3/uL (ref 0.0–0.5)
Eosinophils Relative: 2 %
HCT: 46.2 % — ABNORMAL HIGH (ref 36.0–46.0)
Hemoglobin: 14.6 g/dL (ref 12.0–15.0)
Immature Granulocytes: 0 %
Lymphocytes Relative: 28 %
Lymphs Abs: 1.9 10*3/uL (ref 0.7–4.0)
MCH: 27.4 pg (ref 26.0–34.0)
MCHC: 31.6 g/dL (ref 30.0–36.0)
MCV: 86.7 fL (ref 80.0–100.0)
Monocytes Absolute: 0.7 10*3/uL (ref 0.1–1.0)
Monocytes Relative: 10 %
Neutro Abs: 4 10*3/uL (ref 1.7–7.7)
Neutrophils Relative %: 59 %
Platelets: 366 10*3/uL (ref 150–400)
RBC: 5.33 MIL/uL — ABNORMAL HIGH (ref 3.87–5.11)
RDW: 13.6 % (ref 11.5–15.5)
WBC: 6.8 10*3/uL (ref 4.0–10.5)
nRBC: 0 % (ref 0.0–0.2)

## 2021-02-09 LAB — URINALYSIS, ROUTINE W REFLEX MICROSCOPIC
Bilirubin Urine: NEGATIVE
Glucose, UA: NEGATIVE mg/dL
Hgb urine dipstick: NEGATIVE
Ketones, ur: NEGATIVE mg/dL
Leukocytes,Ua: NEGATIVE
Nitrite: NEGATIVE
Protein, ur: NEGATIVE mg/dL
Specific Gravity, Urine: 1.028 (ref 1.005–1.030)
pH: 5 (ref 5.0–8.0)

## 2021-02-09 LAB — COMPREHENSIVE METABOLIC PANEL
ALT: 29 U/L (ref 0–44)
AST: 23 U/L (ref 15–41)
Albumin: 3.4 g/dL — ABNORMAL LOW (ref 3.5–5.0)
Alkaline Phosphatase: 88 U/L (ref 38–126)
Anion gap: 7 (ref 5–15)
BUN: 19 mg/dL (ref 6–20)
CO2: 26 mmol/L (ref 22–32)
Calcium: 8.3 mg/dL — ABNORMAL LOW (ref 8.9–10.3)
Chloride: 104 mmol/L (ref 98–111)
Creatinine, Ser: 0.61 mg/dL (ref 0.44–1.00)
GFR, Estimated: >60 mL/min (ref >60)
Glucose, Bld: 116 mg/dL — ABNORMAL HIGH (ref 70–99)
Potassium: 3.6 mmol/L (ref 3.5–5.1)
Sodium: 137 mmol/L (ref 135–145)
Total Bilirubin: 0.5 mg/dL (ref 0.3–1.2)
Total Protein: 6.9 g/dL (ref 6.5–8.1)

## 2021-02-09 LAB — LIPASE, BLOOD: Lipase: 21 U/L (ref 11–51)

## 2021-02-09 MED ORDER — SUCRALFATE 1 GM/10ML PO SUSP
1.0000 g | Freq: Three times a day (TID) | ORAL | Status: DC
  Administered 2021-02-09: 1 g via ORAL
  Filled 2021-02-09: qty 10

## 2021-02-09 MED ORDER — ALUM & MAG HYDROXIDE-SIMETH 200-200-20 MG/5ML PO SUSP
30.0000 mL | Freq: Once | ORAL | Status: AC
  Administered 2021-02-09: 30 mL via ORAL
  Filled 2021-02-09: qty 30

## 2021-02-09 MED ORDER — OXYCODONE-ACETAMINOPHEN 5-325 MG PO TABS
1.0000 | ORAL_TABLET | Freq: Once | ORAL | Status: AC
  Administered 2021-02-09: 1 via ORAL
  Filled 2021-02-09: qty 1

## 2021-02-09 NOTE — ED Provider Notes (Signed)
[facility] EMERGENCY DEPARTMENT Provider Note   CSN: [id number] Arrival date & time: 02/09/21  1900     History Chief Complaint  Patient presents with  . Abdominal Pain    [name] is a 50 y.o. female.  HPI   Patient presents with abdominal pain. She reports it started Tuesday night and was associated with one episode of vomiting. It returned Wednesday morning and she vomited again. The pain comes and goes. It is sharp, upper epigastric pain that occasionally radiates to the back. She denies any association with food or bowel movements. She denies diarrhea or constipation. Her last BM was this morning. No blood in the stool. She does not use NSAIDS. Infrequent drinker (less than 4 drinks/year). She is status post hysterectomy but denies any other abdominal surgeries.    Past Medical History:  Diagnosis Date  . Hypertension     Patient Active Problem List   Diagnosis Date Noted  . 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 DEPRESSION 08/08/2009  . GOITER, UNSPECIFIED 04/24/2009  . HYPERLIPIDEMIA 04/24/2009  . HYPERTENSION 04/24/2009  . OBESITY 02/23/2009  . RENAL CYST, RIGHT 06/13/2008    Past Surgical History:  Procedure Laterality Date  . ABDOMINAL HYSTERECTOMY    . COLON SURGERY     colonoscopy  . TONSILLECTOMY       OB History   No obstetric history on file.     No family history on file.  Social History   Tobacco Use  . Smoking status: Never Smoker  Substance Use Topics  . Alcohol use: No  . Drug use: No    Home Medications Prior to Admission medications   Medication Sig Start Date End Date Taking? Authorizing Provider  aspirin 81 MG tablet Take 81 mg by mouth daily.    [provider]  cholecalciferol (VITAMIN D) 1000 UNITS tablet Take 1,000 Units by mouth daily.    [provider]  HYDROcodone-acetaminophen (NORCO/VICODIN) 5-325 MG per tablet Take one  tab po q 4-6 hrs prn pain 05/21/15   Triplett, Tammy, PA-C   lisinopril (PRINIVIL,ZESTRIL) 5 MG tablet Take 5 mg by mouth daily.    [provider]  LYSINE HCL PO Take 1 tablet by mouth daily.    [provider]  ondansetron (ZOFRAN) 4 MG tablet Take 1 tablet (4 mg total) by mouth every 6 (six) hours. 05/21/15   Triplett, Babette Relic, PA-C    Allergies    Patient has no known allergies.  Review of Systems   Review of Systems  Constitutional: Negative for chills and fever.  HENT: Negative for ear pain and sore throat.   Eyes: Negative for pain and visual disturbance.  Respiratory: Negative for cough and shortness of breath.   Cardiovascular: Negative for chest pain and palpitations.  Gastrointestinal: Positive for abdominal pain, nausea and vomiting. Negative for abdominal distention, constipation and diarrhea.  Genitourinary: Negative for dysuria and hematuria.  Musculoskeletal: Negative for arthralgias and back pain.  Skin: Negative for color change and rash.  Neurological: Negative for seizures and syncope.  All other systems reviewed and are negative.   Physical Exam Updated Vital Signs BP (!) 160/99 (BP Location: Right Arm)   Pulse 87   Temp 98.6 F (37 C) (Oral)   Resp 18   Ht 5\' 3"  (1.6 m)   Wt 88 kg   SpO2 99%   BMI 34.37 kg/m   Physical Exam Vitals and nursing note reviewed. Exam conducted with a chaperone present.  Constitutional:  General: She is not in acute distress.    Appearance: Normal appearance. She is well-developed.  HENT:     Head: Normocephalic and atraumatic.  Eyes:     General: No scleral icterus.       Right eye: No discharge.        Left eye: No discharge.     Extraocular Movements: Extraocular movements intact.     Conjunctiva/sclera: Conjunctivae normal.     Pupils: Pupils are equal, round, and reactive to light.  Cardiovascular:     Rate and Rhythm: Normal rate and regular rhythm.     Pulses: Normal pulses.     Heart sounds: Normal heart sounds. No murmur heard. No friction rub.  No gallop.   Pulmonary:     Effort: Pulmonary effort is normal. No respiratory distress.     Breath sounds: Normal breath sounds.  Abdominal:     General: Abdomen is flat. Bowel sounds are normal. There is no distension.     Palpations: Abdomen is soft.     Tenderness: There is abdominal tenderness in the epigastric area. There is no right CVA tenderness, left CVA tenderness, guarding or rebound.  Musculoskeletal:     Cervical back: Neck supple.  Skin:    General: Skin is warm and dry.     Coloration: Skin is not jaundiced.  Neurological:     Mental Status: She is alert. Mental status is at baseline.     Coordination: Coordination normal.     ED Results / Procedures / Treatments   Labs (all labs ordered are listed, but only abnormal results are displayed) Labs Reviewed - No data to display  EKG None  Radiology No results found.  Procedures Procedures   Medications Ordered in ED Medications - No data to display  ED Course  I have reviewed the triage vital signs and the nursing notes.  Pertinent labs & imaging results that were available during my care of the patient were reviewed by me and considered in my medical decision making (see chart for details).    MDM Rules/Calculators/A&P                         Vitals stable. PE showed TTP to the epigastric area.   Lipase: low. Doubt pancreatitis.   CMP: Some elevated glucose and mildly decreased calcium. No gross electrolyte   derrangement or AKI.   CBC: No white count suggesting infection, no anemia   Lab work reassuring. Suspect the patient has gastritis. Giving patient a GI cocktail.  Doubt mesenteric ischemia - patient isn't elderly with history of AF/ischemic disease Doubt AAA. No cardiac history or chest pain. Also too young.  Doubt PUD: not a drinker, no history of ulcers, doesn't routinely use NSAIDs Pain could be from Gallbladder, but doubt it given the location and lack of relation with food.  Ordered CT  without contrast. Also gave patient carafate and percocet for pain. If CT scan comes back negative will discharge patient with antinausea medicine to follow up with PCP and have outpatient abdominal ultrasound done. Patient signed out to attending physician. Final dispo pending.   Discussed HPI, physical exam and plan of care for this patient with attending Ankit Nanavati. The attending physician evaluated this patient as part of a shared visit and agrees with plan of care.  Final Clinical Impression(s) / ED Diagnoses Final diagnoses:  None    Rx / DC Orders ED Discharge Orders    None  Theron Arista, PA-C 02/09/21 2209    Derwood Kaplan, MD 02/13/21 1112

## 2021-02-09 NOTE — ED Notes (Signed)
edp in room  

## 2021-02-09 NOTE — ED Triage Notes (Signed)
Pt reports abdominal pain since Wednesday, pt vomited once on Wednesday, but none since then. Pt reports pain is intermittent and has nausea. Pt reports BM today normal.

## 2021-02-10 MED ORDER — PROMETHAZINE HCL 25 MG RE SUPP: 25.0000 mg | Freq: Four times a day (QID) | RECTAL | 0 refills | Status: AC | PRN

## 2021-02-10 MED ORDER — ONDANSETRON 8 MG PO TBDP: 8.0000 mg | ORAL_TABLET | Freq: Three times a day (TID) | ORAL | 0 refills | Status: AC | PRN

## 2021-02-10 MED ORDER — HYDROCODONE-ACETAMINOPHEN 5-325 MG PO TABS: 2.0000 | ORAL_TABLET | ORAL | 0 refills | Status: AC | PRN

## 2021-02-10 NOTE — Discharge Instructions (Signed)
We saw you in the ER for the abdominal pain. All of our results are normal, including all labs and imaging. Kidney function is fine as well. We are not sure what is causing your abdominal pain, and recommend that you see your primary care doctor within 2-3 days for further evaluation. If your symptoms get worse, return to the ER. Take the pain meds and nausea meds as prescribed.  Please return to the ER if your symptoms worsen; you have increased pain, fevers, chills, inability to keep any medications down, confusion. Otherwise see the outpatient doctor as requested.

## 2022-04-26 DIAGNOSIS — R030 Elevated blood-pressure reading, without diagnosis of hypertension: Secondary | ICD-10-CM | POA: Diagnosis not present

## 2022-04-26 DIAGNOSIS — Z0001 Encounter for general adult medical examination with abnormal findings: Secondary | ICD-10-CM | POA: Diagnosis not present

## 2022-04-26 DIAGNOSIS — R350 Frequency of micturition: Secondary | ICD-10-CM | POA: Diagnosis not present

## 2022-05-27 DIAGNOSIS — Z0001 Encounter for general adult medical examination with abnormal findings: Secondary | ICD-10-CM | POA: Diagnosis not present

## 2022-05-27 DIAGNOSIS — G43001 Migraine without aura, not intractable, with status migrainosus: Secondary | ICD-10-CM | POA: Diagnosis not present

## 2022-05-27 DIAGNOSIS — M62838 Other muscle spasm: Secondary | ICD-10-CM | POA: Diagnosis not present

## 2022-05-27 DIAGNOSIS — I10 Essential (primary) hypertension: Secondary | ICD-10-CM | POA: Diagnosis not present

## 2022-05-27 DIAGNOSIS — J312 Chronic pharyngitis: Secondary | ICD-10-CM | POA: Diagnosis not present

## 2022-07-10 DIAGNOSIS — H1033 Unspecified acute conjunctivitis, bilateral: Secondary | ICD-10-CM | POA: Diagnosis not present

## 2022-07-18 DIAGNOSIS — R070 Pain in throat: Secondary | ICD-10-CM | POA: Diagnosis not present

## 2022-07-18 DIAGNOSIS — J0140 Acute pansinusitis, unspecified: Secondary | ICD-10-CM | POA: Diagnosis not present

## 2022-07-24 DIAGNOSIS — N3001 Acute cystitis with hematuria: Secondary | ICD-10-CM | POA: Diagnosis not present

## 2022-08-15 ENCOUNTER — Other Ambulatory Visit: Payer: Self-pay | Admitting: Nurse Practitioner

## 2022-08-15 DIAGNOSIS — Z1231 Encounter for screening mammogram for malignant neoplasm of breast: Secondary | ICD-10-CM

## 2022-09-25 ENCOUNTER — Other Ambulatory Visit: Payer: Self-pay | Admitting: Nurse Practitioner

## 2022-09-25 ENCOUNTER — Ambulatory Visit
Admission: RE | Admit: 2022-09-25 | Discharge: 2022-09-25 | Disposition: A | Discharge status: Home / Self Care | Payer: BC Managed Care – PPO | Source: Ambulatory Visit | Attending: Nurse Practitioner | Admitting: Nurse Practitioner

## 2022-09-25 ENCOUNTER — Other Ambulatory Visit: Payer: Self-pay | Admitting: Physician Assistant

## 2022-09-25 DIAGNOSIS — Z1231 Encounter for screening mammogram for malignant neoplasm of breast: Secondary | ICD-10-CM

## 2022-10-01 ENCOUNTER — Other Ambulatory Visit: Payer: Self-pay | Admitting: Nurse Practitioner

## 2022-10-01 DIAGNOSIS — R928 Other abnormal and inconclusive findings on diagnostic imaging of breast: Secondary | ICD-10-CM

## 2022-10-07 DIAGNOSIS — F411 Generalized anxiety disorder: Secondary | ICD-10-CM | POA: Diagnosis not present

## 2022-10-08 ENCOUNTER — Ambulatory Visit
Admission: RE | Admit: 2022-10-08 | Discharge: 2022-10-08 | Disposition: A | Discharge status: Home / Self Care | Payer: BC Managed Care – PPO | Source: Ambulatory Visit | Attending: Nurse Practitioner | Admitting: Nurse Practitioner

## 2022-10-08 ENCOUNTER — Other Ambulatory Visit: Payer: Self-pay | Admitting: Nurse Practitioner

## 2022-10-08 DIAGNOSIS — R928 Other abnormal and inconclusive findings on diagnostic imaging of breast: Secondary | ICD-10-CM

## 2022-10-08 DIAGNOSIS — N6310 Unspecified lump in the right breast, unspecified quadrant: Secondary | ICD-10-CM

## 2022-10-08 DIAGNOSIS — N6312 Unspecified lump in the right breast, upper inner quadrant: Secondary | ICD-10-CM | POA: Diagnosis not present

## 2022-10-10 ENCOUNTER — Ambulatory Visit
Admission: RE | Admit: 2022-10-10 | Discharge: 2022-10-10 | Disposition: A | Discharge status: Home / Self Care | Payer: BC Managed Care – PPO | Source: Ambulatory Visit | Attending: Nurse Practitioner | Admitting: Nurse Practitioner

## 2022-10-10 DIAGNOSIS — R928 Other abnormal and inconclusive findings on diagnostic imaging of breast: Secondary | ICD-10-CM

## 2022-10-10 DIAGNOSIS — Z79899 Other long term (current) drug therapy: Secondary | ICD-10-CM | POA: Diagnosis not present

## 2022-10-10 DIAGNOSIS — N6310 Unspecified lump in the right breast, unspecified quadrant: Secondary | ICD-10-CM

## 2022-10-10 DIAGNOSIS — N6312 Unspecified lump in the right breast, upper inner quadrant: Secondary | ICD-10-CM | POA: Diagnosis not present

## 2022-10-10 DIAGNOSIS — N6011 Diffuse cystic mastopathy of right breast: Secondary | ICD-10-CM | POA: Diagnosis not present

## 2022-10-10 DIAGNOSIS — F902 Attention-deficit hyperactivity disorder, combined type: Secondary | ICD-10-CM | POA: Diagnosis not present

## 2022-10-10 HISTORY — PX: BREAST BIOPSY: SHX20

## 2022-10-15 ENCOUNTER — Other Ambulatory Visit: Payer: Self-pay | Admitting: Nurse Practitioner

## 2022-10-15 DIAGNOSIS — R928 Other abnormal and inconclusive findings on diagnostic imaging of breast: Secondary | ICD-10-CM

## 2022-10-23 DIAGNOSIS — J020 Streptococcal pharyngitis: Secondary | ICD-10-CM | POA: Diagnosis not present

## 2022-10-25 ENCOUNTER — Ambulatory Visit
Admission: RE | Admit: 2022-10-25 | Discharge: 2022-10-25 | Disposition: A | Discharge status: Home / Self Care | Payer: BC Managed Care – PPO | Source: Ambulatory Visit | Attending: Nurse Practitioner | Admitting: Nurse Practitioner

## 2022-10-25 DIAGNOSIS — R921 Mammographic calcification found on diagnostic imaging of breast: Secondary | ICD-10-CM | POA: Diagnosis not present

## 2022-10-25 DIAGNOSIS — R070 Pain in throat: Secondary | ICD-10-CM | POA: Diagnosis not present

## 2022-10-25 DIAGNOSIS — R928 Other abnormal and inconclusive findings on diagnostic imaging of breast: Secondary | ICD-10-CM

## 2022-10-25 DIAGNOSIS — J020 Streptococcal pharyngitis: Secondary | ICD-10-CM | POA: Diagnosis not present

## 2022-10-25 DIAGNOSIS — N6011 Diffuse cystic mastopathy of right breast: Secondary | ICD-10-CM | POA: Diagnosis not present

## 2022-10-25 DIAGNOSIS — Z79899 Other long term (current) drug therapy: Secondary | ICD-10-CM | POA: Diagnosis not present

## 2022-10-25 DIAGNOSIS — Z5181 Encounter for therapeutic drug level monitoring: Secondary | ICD-10-CM | POA: Diagnosis not present

## 2022-10-25 DIAGNOSIS — Z79891 Long term (current) use of opiate analgesic: Secondary | ICD-10-CM | POA: Diagnosis not present

## 2022-10-25 HISTORY — PX: BREAST BIOPSY: SHX20

## 2022-12-18 DIAGNOSIS — J0140 Acute pansinusitis, unspecified: Secondary | ICD-10-CM | POA: Diagnosis not present

## 2023-02-17 DIAGNOSIS — J0110 Acute frontal sinusitis, unspecified: Secondary | ICD-10-CM | POA: Diagnosis not present

## 2023-02-17 DIAGNOSIS — R070 Pain in throat: Secondary | ICD-10-CM | POA: Diagnosis not present

## 2023-02-27 DIAGNOSIS — M5442 Lumbago with sciatica, left side: Secondary | ICD-10-CM | POA: Diagnosis not present

## 2023-02-27 DIAGNOSIS — Z0000 Encounter for general adult medical examination without abnormal findings: Secondary | ICD-10-CM | POA: Diagnosis not present

## 2023-02-27 DIAGNOSIS — Z13220 Encounter for screening for lipoid disorders: Secondary | ICD-10-CM | POA: Diagnosis not present

## 2023-02-27 DIAGNOSIS — M5441 Lumbago with sciatica, right side: Secondary | ICD-10-CM | POA: Diagnosis not present

## 2023-03-14 DIAGNOSIS — G43119 Migraine with aura, intractable, without status migrainosus: Secondary | ICD-10-CM | POA: Diagnosis not present

## 2023-05-10 IMAGING — CT CT ABD-PELV W/O CM
2 of 4 series · 16 of 46 positions shown, 18 images · non-contrast
Comparison: CT 05/21/2015

CLINICAL DATA: Abdominal pain vomiting

EXAM:
CT ABDOMEN AND PELVIS WITHOUT CONTRAST
TECHNIQUE: Multidetector CT imaging of the abdomen and pelvis was performed
following the standard protocol without IV contrast.

[Series 2: axial st · axial · 0.79mm/px · z∈[-690,-236]mm · 13 of 105 slices shown, 15 images]
[im 7/105  soft-tissue]
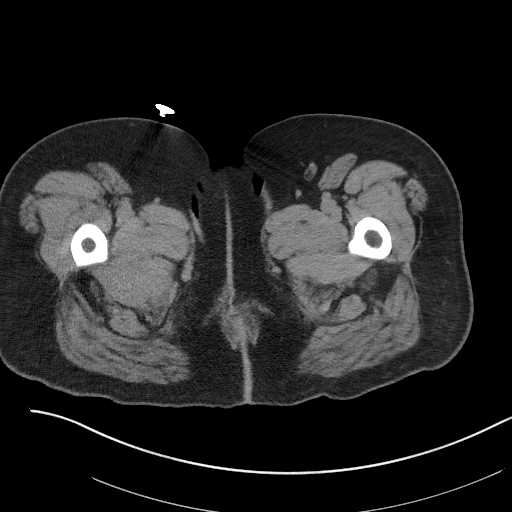
[im 7/105  bone]
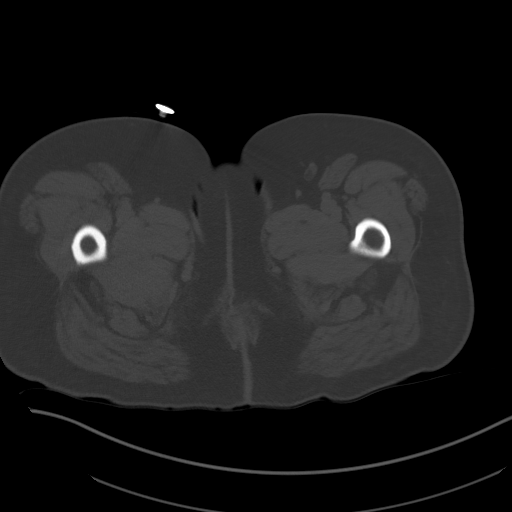
[im 14/105  soft-tissue]
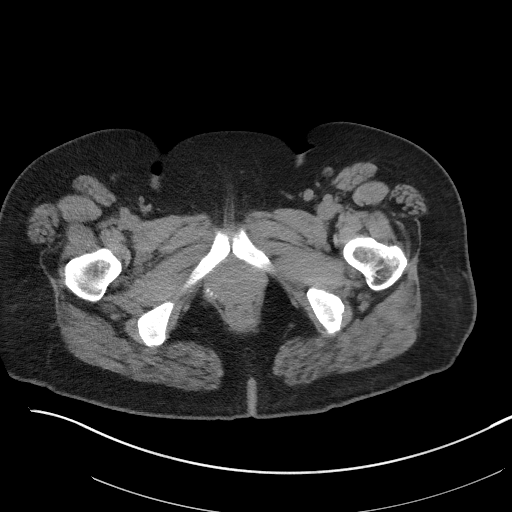
[im 20/105  soft-tissue]
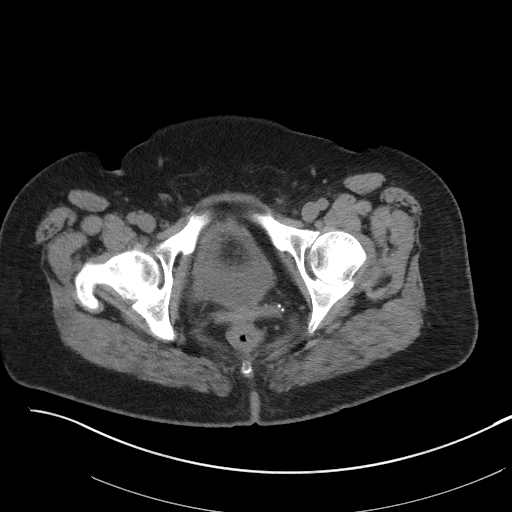
[im 33/105  soft-tissue]
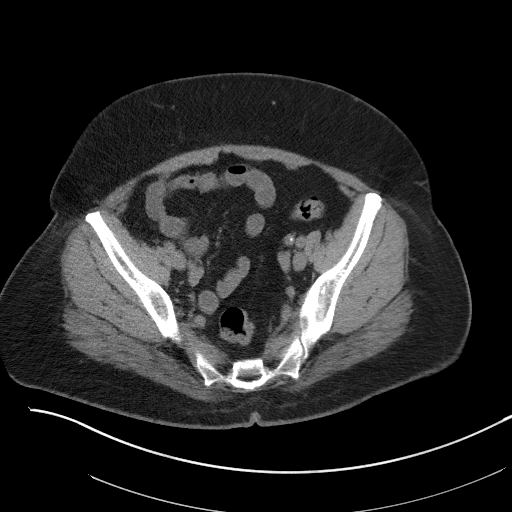
[im 40/105  soft-tissue]
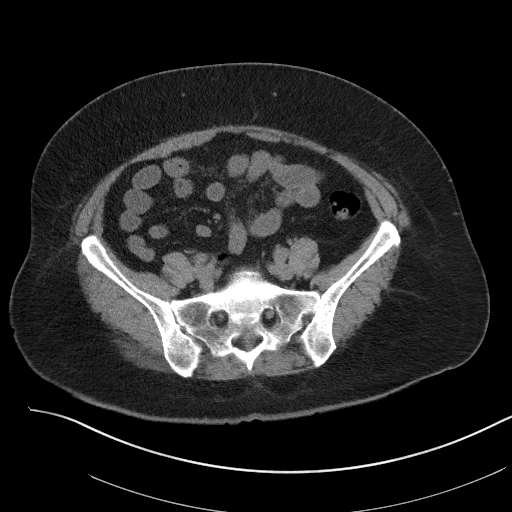
[im 46/105  soft-tissue]
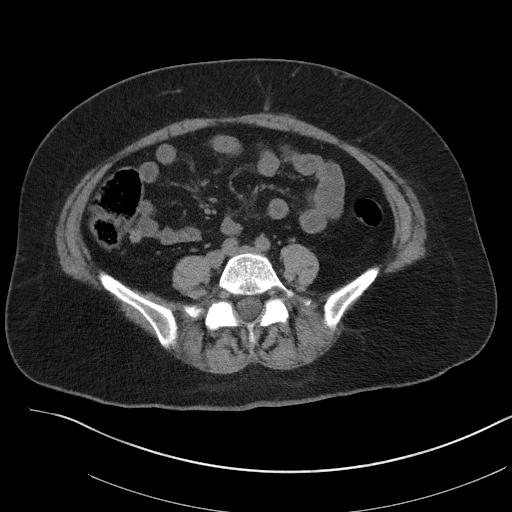
[im 53/105  soft-tissue]
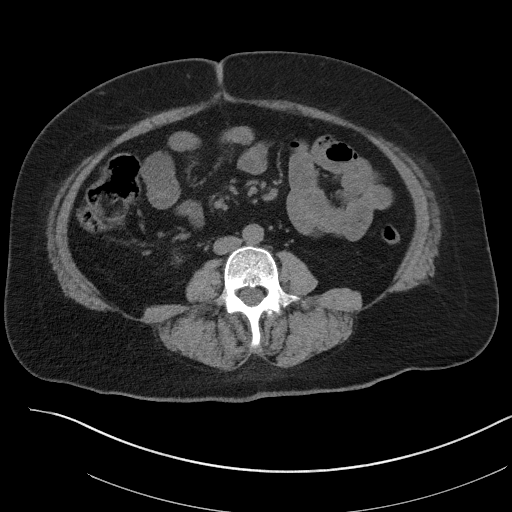
[im 59/105  soft-tissue]
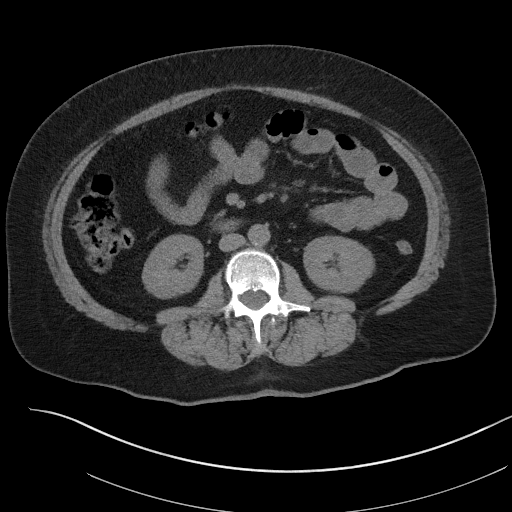
[im 66/105  soft-tissue]
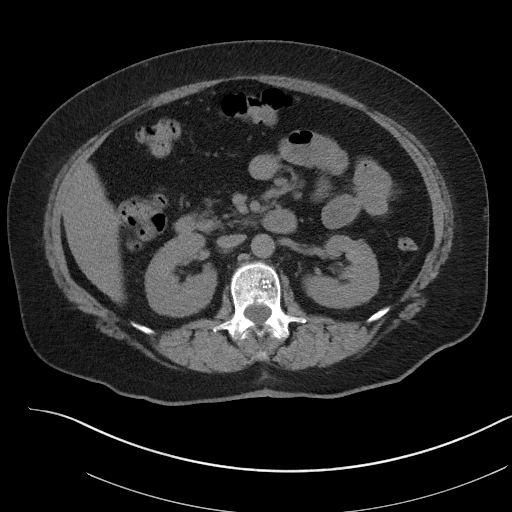
[im 66/105  bone]
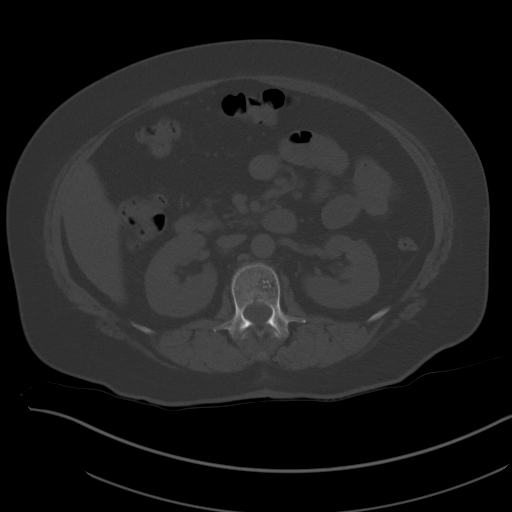
[im 72/105  soft-tissue]
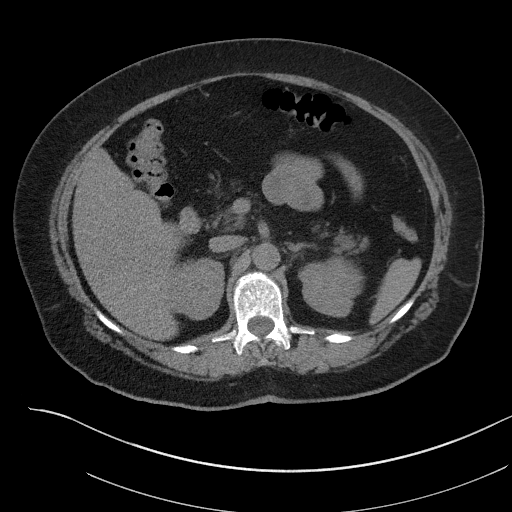
[im 85/105  soft-tissue]
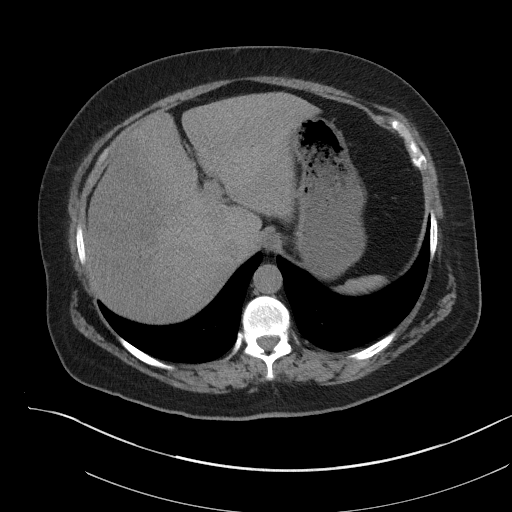
[im 92/105  soft-tissue]
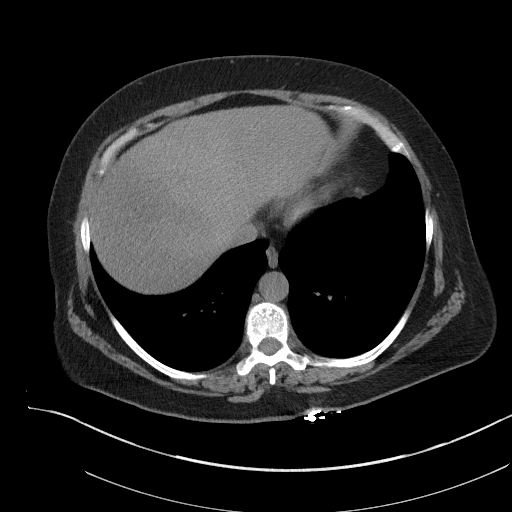
[im 98/105  soft-tissue]
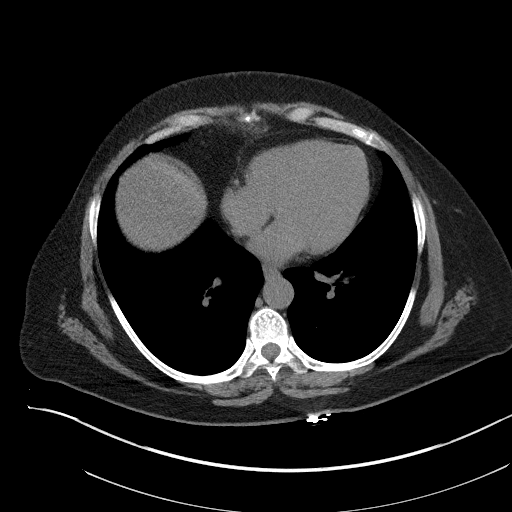

[Series 5: coronal st · coronal · 0.80mm/px · 3 of 107 slices shown]
[im 36/107  soft-tissue]
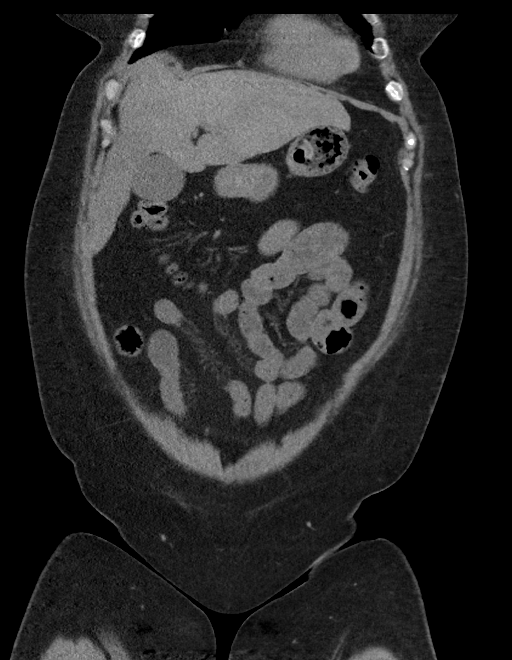
[im 48/107  soft-tissue]
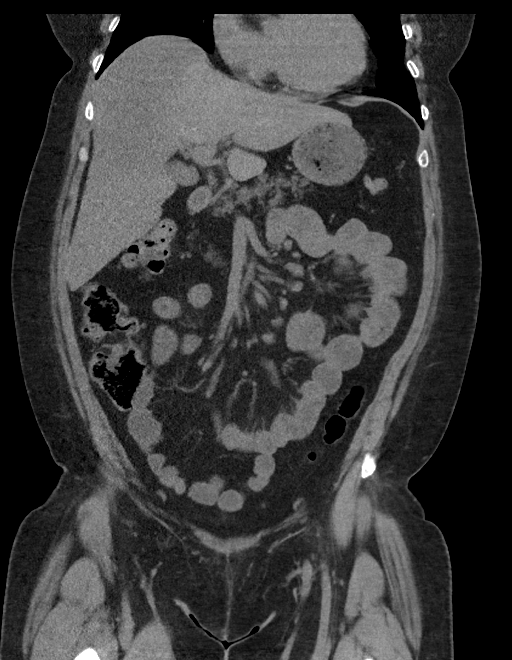
[im 59/107  soft-tissue]
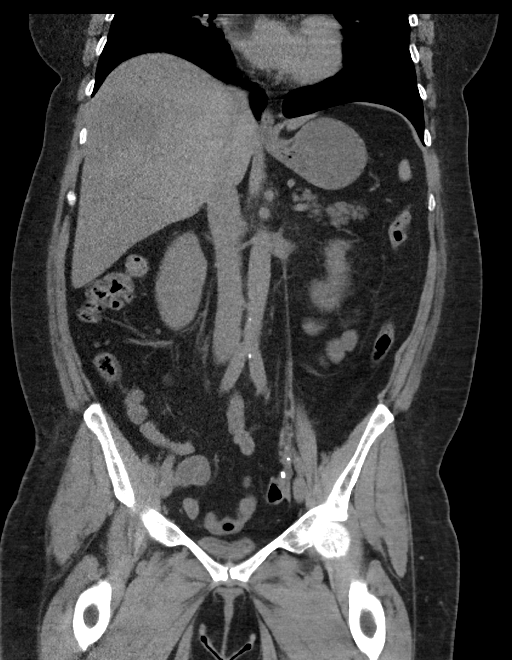

[16 of 46 positions shown; findings below may reference images not displayed]

FINDINGS: Lower chest: Lung bases demonstrate no acute consolidation or
effusion.

Hepatobiliary: Heterogenous fat infiltration of the liver. No
calcified gallstone or biliary dilatation

Pancreas: Unremarkable. No pancreatic ductal dilatation or
surrounding inflammatory changes.

Spleen: Normal in size without focal abnormality.

Adrenals/Urinary Tract: Adrenal glands are unremarkable. Kidneys are
normal, without renal calculi, focal lesion, or hydronephrosis.
Bladder is unremarkable.

Stomach/Bowel: Stomach is within normal limits. Appendix appears
normal. No evidence of bowel wall thickening, distention, or
inflammatory changes. Mild colon diverticular disease without acute
inflammatory change.

Vascular/Lymphatic: No significant vascular findings are present. No
enlarged abdominal or pelvic lymph nodes. Minimal aortic
atherosclerosis.

Reproductive: Status post hysterectomy. No adnexal masses.

Other: Negative for free air or free fluid

Musculoskeletal: No acute or significant osseous findings.
IMPRESSION: 1. No CT evidence for acute intra-abdominal or pelvic abnormality.
2. Mild colon diverticular disease without acute inflammatory change

## 2023-05-27 DIAGNOSIS — G43119 Migraine with aura, intractable, without status migrainosus: Secondary | ICD-10-CM | POA: Diagnosis not present

## 2023-10-31 ENCOUNTER — Other Ambulatory Visit: Payer: Self-pay | Admitting: Physician Assistant

## 2023-10-31 DIAGNOSIS — Z1231 Encounter for screening mammogram for malignant neoplasm of breast: Secondary | ICD-10-CM

## 2023-11-14 ENCOUNTER — Ambulatory Visit: Payer: BC Managed Care – PPO

## 2023-11-28 ENCOUNTER — Other Ambulatory Visit (HOSPITAL_BASED_OUTPATIENT_CLINIC_OR_DEPARTMENT_OTHER): Payer: Self-pay

## 2023-11-28 DIAGNOSIS — F902 Attention-deficit hyperactivity disorder, combined type: Secondary | ICD-10-CM | POA: Diagnosis not present

## 2023-11-28 DIAGNOSIS — Z79899 Other long term (current) drug therapy: Secondary | ICD-10-CM | POA: Diagnosis not present

## 2023-11-28 MED ORDER — LISDEXAMFETAMINE DIMESYLATE 70 MG PO CAPS
70.0000 mg | ORAL_CAPSULE | Freq: Every day | ORAL | 0 refills | Status: DC
  Filled 2023-11-28: qty 30, 30d supply, fill #0

## 2023-12-03 ENCOUNTER — Ambulatory Visit
Admission: RE | Admit: 2023-12-03 | Discharge: 2023-12-03 | Disposition: A | Discharge status: Home / Self Care | Payer: BC Managed Care – PPO | Source: Ambulatory Visit | Attending: Physician Assistant | Admitting: Physician Assistant

## 2023-12-03 DIAGNOSIS — Z1231 Encounter for screening mammogram for malignant neoplasm of breast: Secondary | ICD-10-CM | POA: Diagnosis not present

## 2024-01-06 ENCOUNTER — Other Ambulatory Visit (HOSPITAL_BASED_OUTPATIENT_CLINIC_OR_DEPARTMENT_OTHER): Payer: Self-pay

## 2024-01-06 MED ORDER — LISDEXAMFETAMINE DIMESYLATE 70 MG PO CAPS
70.0000 mg | ORAL_CAPSULE | Freq: Every day | ORAL | 0 refills | Status: AC
  Filled 2024-01-06: qty 30, 30d supply, fill #0

## 2024-02-05 ENCOUNTER — Other Ambulatory Visit (HOSPITAL_BASED_OUTPATIENT_CLINIC_OR_DEPARTMENT_OTHER): Payer: Self-pay

## 2024-02-05 DIAGNOSIS — I10 Essential (primary) hypertension: Secondary | ICD-10-CM | POA: Diagnosis not present

## 2024-02-05 DIAGNOSIS — H538 Other visual disturbances: Secondary | ICD-10-CM | POA: Diagnosis not present

## 2024-02-05 DIAGNOSIS — E1165 Type 2 diabetes mellitus with hyperglycemia: Secondary | ICD-10-CM | POA: Diagnosis not present

## 2024-02-05 DIAGNOSIS — G43001 Migraine without aura, not intractable, with status migrainosus: Secondary | ICD-10-CM | POA: Diagnosis not present

## 2024-02-09 DIAGNOSIS — G4733 Obstructive sleep apnea (adult) (pediatric): Secondary | ICD-10-CM | POA: Diagnosis not present

## 2024-02-10 DIAGNOSIS — G4733 Obstructive sleep apnea (adult) (pediatric): Secondary | ICD-10-CM | POA: Diagnosis not present

## 2024-03-02 DIAGNOSIS — G4733 Obstructive sleep apnea (adult) (pediatric): Secondary | ICD-10-CM | POA: Diagnosis not present

## 2024-03-03 DIAGNOSIS — R070 Pain in throat: Secondary | ICD-10-CM | POA: Diagnosis not present

## 2024-03-03 DIAGNOSIS — J0140 Acute pansinusitis, unspecified: Secondary | ICD-10-CM | POA: Diagnosis not present

## 2024-03-03 DIAGNOSIS — E1165 Type 2 diabetes mellitus with hyperglycemia: Secondary | ICD-10-CM | POA: Diagnosis not present

## 2024-03-23 DIAGNOSIS — F411 Generalized anxiety disorder: Secondary | ICD-10-CM | POA: Diagnosis not present

## 2024-04-01 DIAGNOSIS — G4733 Obstructive sleep apnea (adult) (pediatric): Secondary | ICD-10-CM | POA: Diagnosis not present

## 2024-04-08 DIAGNOSIS — G4733 Obstructive sleep apnea (adult) (pediatric): Secondary | ICD-10-CM | POA: Diagnosis not present

## 2024-05-02 DIAGNOSIS — G4733 Obstructive sleep apnea (adult) (pediatric): Secondary | ICD-10-CM | POA: Diagnosis not present

## 2024-05-17 DIAGNOSIS — G4733 Obstructive sleep apnea (adult) (pediatric): Secondary | ICD-10-CM | POA: Diagnosis not present

## 2024-05-24 DIAGNOSIS — R070 Pain in throat: Secondary | ICD-10-CM | POA: Diagnosis not present

## 2024-05-24 DIAGNOSIS — J0140 Acute pansinusitis, unspecified: Secondary | ICD-10-CM | POA: Diagnosis not present

## 2024-06-02 DIAGNOSIS — G4733 Obstructive sleep apnea (adult) (pediatric): Secondary | ICD-10-CM | POA: Diagnosis not present

## 2024-06-11 DIAGNOSIS — G4733 Obstructive sleep apnea (adult) (pediatric): Secondary | ICD-10-CM | POA: Diagnosis not present

## 2024-07-05 ENCOUNTER — Inpatient Hospital Stay: Admission: RE | Admit: 2024-07-05 | Source: Ambulatory Visit

## 2024-09-01 DIAGNOSIS — G4733 Obstructive sleep apnea (adult) (pediatric): Secondary | ICD-10-CM | POA: Diagnosis not present
